# Patient Record
Sex: Male | Born: 1995 | Race: Black or African American | Hispanic: No | Marital: Single | Smoking: Never smoker
Health system: Southern US, Community
[De-identification: ages and names within clinical notes are randomized; demographics above are authoritative.]

## PROBLEM LIST (undated history)

## (undated) ENCOUNTER — Emergency Department (HOSPITAL_COMMUNITY): Admission: EM | Payer: 59 | Source: Home / Self Care

---
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
## 2013-03-03 ENCOUNTER — Emergency Department (HOSPITAL_COMMUNITY)
Admission: EM | Admit: 2013-03-03 | Discharge: 2013-03-04 | Disposition: A | Discharge status: Home / Self Care | Payer: 59 | Attending: Emergency Medicine | Admitting: Emergency Medicine

## 2013-03-03 ENCOUNTER — Encounter (HOSPITAL_COMMUNITY): Payer: Self-pay

## 2013-03-03 DIAGNOSIS — S01501A Unspecified open wound of lip, initial encounter: Secondary | ICD-10-CM | POA: Insufficient documentation

## 2013-03-03 DIAGNOSIS — IMO0002 Reserved for concepts with insufficient information to code with codable children: Secondary | ICD-10-CM | POA: Insufficient documentation

## 2013-03-03 DIAGNOSIS — Y929 Unspecified place or not applicable: Secondary | ICD-10-CM | POA: Insufficient documentation

## 2013-03-03 DIAGNOSIS — Z23 Encounter for immunization: Secondary | ICD-10-CM | POA: Insufficient documentation

## 2013-03-03 DIAGNOSIS — Y939 Activity, unspecified: Secondary | ICD-10-CM | POA: Insufficient documentation

## 2013-03-03 DIAGNOSIS — S01511A Laceration without foreign body of lip, initial encounter: Secondary | ICD-10-CM

## 2013-03-03 MED ORDER — LIDOCAINE HCL 1 % IJ SOLN
5.0000 mL | Freq: Once | INTRAMUSCULAR | Status: AC
  Administered 2013-03-04: 5 mL via INTRADERMAL
  Filled 2013-03-03: qty 20

## 2013-03-03 MED ORDER — OXYCODONE-ACETAMINOPHEN 5-325 MG PO TABS
2.0000 | ORAL_TABLET | Freq: Once | ORAL | Status: AC
  Administered 2013-03-04: 2 via ORAL
  Filled 2013-03-03: qty 2

## 2013-03-03 MED ORDER — TETANUS-DIPHTH-ACELL PERTUSSIS 5-2.5-18.5 LF-MCG/0.5 IM SUSP
0.5000 mL | Freq: Once | INTRAMUSCULAR | Status: AC
  Administered 2013-03-04: 0.5 mL via INTRAMUSCULAR
  Filled 2013-03-03: qty 0.5
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
## 2013-03-04 NOTE — ED Provider Notes (Signed)
Medical screening examination/treatment/procedure(s) were conducted as a shared visit with non-physician practitioner(s) and myself.  I personally evaluated the patient during the encounter  NERVE BLOCK Performed by: Lyanne Co Consent: Verbal consent obtained. Required items: required blood products, implants, devices, and special equipment available Time out: Immediately prior to procedure a "time out" was called to verify the correct patient, procedure, equipment, support staff and site/side marked as required. Indication: Laceration repair of the lip  Nerve block body site: Right infraorbital nerve  Preparation: Patient was prepped and draped in the usual sterile fashion. Needle gauge: 24 G Location technique: anatomical landmarks Local anesthetic: Lidocaine 1% without epi  Anesthetic total: 4 ml Outcome: pain improved Patient tolerance: Patient tolerated the procedure well with no immediate complications.  Vermilion border repair.   Complex lip laceration    Lyanne Co, MD 03/04/13 [phone]

## 2013-03-12 ENCOUNTER — Emergency Department (HOSPITAL_COMMUNITY)
Admission: EM | Admit: 2013-03-12 | Discharge: 2013-03-12 | Disposition: A | Discharge status: Home / Self Care | Payer: 59 | Attending: Emergency Medicine | Admitting: Emergency Medicine

## 2013-03-12 DIAGNOSIS — Z4802 Encounter for removal of sutures: Secondary | ICD-10-CM | POA: Insufficient documentation

## 2013-03-12 NOTE — ED Provider Notes (Signed)
Medical screening examination/treatment/procedure(s) were performed by non-physician practitioner and as supervising physician I was immediately available for consultation/collaboration.  Ethelda Chick, MD 03/12/13 [phone]

## 2013-03-12 NOTE — ED Notes (Signed)
Pt here for removal of sutures to upper lip. Wound edges approximated well with no signs of infection. Pt ambulatory to exam room with steady gait.

## 2013-03-12 NOTE — ED Notes (Signed)
Pt not in room to receive d/c instructions.  
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
## 2014-06-09 ENCOUNTER — Encounter (HOSPITAL_COMMUNITY): Payer: Self-pay | Admitting: Emergency Medicine

## 2014-06-09 ENCOUNTER — Emergency Department (HOSPITAL_COMMUNITY)
Admission: EM | Admit: 2014-06-09 | Discharge: 2014-06-10 | Disposition: A | Discharge status: Home / Self Care | Payer: 59 | Attending: Emergency Medicine | Admitting: Emergency Medicine

## 2014-06-09 DIAGNOSIS — Z202 Contact with and (suspected) exposure to infections with a predominantly sexual mode of transmission: Secondary | ICD-10-CM | POA: Diagnosis present

## 2014-06-09 DIAGNOSIS — R300 Dysuria: Secondary | ICD-10-CM | POA: Insufficient documentation

## 2014-06-09 DIAGNOSIS — R369 Urethral discharge, unspecified: Secondary | ICD-10-CM | POA: Diagnosis not present

## 2014-06-09 NOTE — ED Notes (Signed)
Pt presents with c/o possible STD exposure. Pt reports that he had unprotected sex approx 3 days ago and is now having discharge, white in color, and burning with urination.

## 2014-06-10 MED ORDER — ONDANSETRON 4 MG PO TBDP
4.0000 mg | ORAL_TABLET | Freq: Once | ORAL | Status: AC
  Administered 2014-06-10: 4 mg via ORAL
  Filled 2014-06-10: qty 1

## 2014-06-10 MED ORDER — CEFTRIAXONE SODIUM 250 MG IJ SOLR
250.0000 mg | Freq: Once | INTRAMUSCULAR | Status: AC
  Administered 2014-06-10: 250 mg via INTRAMUSCULAR
  Filled 2014-06-10: qty 250

## 2014-06-10 MED ORDER — AZITHROMYCIN 250 MG PO TABS
1000.0000 mg | ORAL_TABLET | Freq: Once | ORAL | Status: AC
  Administered 2014-06-10: 1000 mg via ORAL
  Filled 2014-06-10: qty 4

## 2014-06-10 MED ORDER — LIDOCAINE HCL 1 % IJ SOLN
INTRAMUSCULAR | Status: AC
  Administered 2014-06-10: 20 mL
  Filled 2014-06-10: qty 20

## 2014-06-10 NOTE — ED Provider Notes (Signed)
Medical screening examination/treatment/procedure(s) were performed by non-physician practitioner and as supervising physician I was immediately available for consultation/collaboration.   EKG Interpretation None       Zuleyka Kloc K Mayari Matus-Rasch, MD 06/10/14 [phone]

## 2014-06-10 NOTE — ED Provider Notes (Signed)
CSN: [id number]     Arrival date & time 06/09/14  2314 History   First MD Initiated Contact with Patient 06/09/14 2332     Chief Complaint  Patient presents with  . Exposure to STD     (Consider location/radiation/quality/duration/timing/severity/associated sxs/prior Treatment) HPI  Patient to the ED requesting treatment for STD exposure. He had unprotected sex 3 days ago and then developed discharge, white, and is also having some burning with urination. He denies having any suprapubic pain, abdominal pain, nausea, vomiting or diarrhea. Pt not having fevers. No testicular pain. Does not know if he has ever been checked for HIV  History reviewed. No pertinent past medical history. History reviewed. No pertinent past surgical history. No family history on file. History  Substance Use Topics  . Smoking status: Never Smoker   . Smokeless tobacco: Not on file  . Alcohol Use: No    Review of Systems   Review of Systems  Gen: no weight loss, fevers, chills, night sweats  Eyes: no occular draining, occular pain,  No visual changes  Nose: no epistaxis or rhinorrhea  Mouth: no dental pain, no sore throat  Neck: no neck pain  Lungs: No hemoptysis. No wheezing or coughing CV:  No palpitations, dependent edema or orthopnea. No chest pain Abd: no diarrhea. No nausea or vomiting, No abdominal pain  GU: + penile discharge and dysuria MSK:  No muscle weakness, No muscular pain Neuro: no headache, no focal neurologic deficits  Skin: no rash , no wounds Psyche: no complaints of depression or anxiety    Allergies  Review of patient's allergies indicates no known allergies.  Home Medications   Prior to Admission medications   Medication Sig Start Date End Date Taking? Authorizing Provider  ibuprofen (ADVIL,MOTRIN) 200 MG tablet Take 200 mg by mouth every 6 (six) hours as needed for pain (pain).    Historical Provider, MD   BP 133/82  Pulse 91  Temp(Src) 98 F (36.7 C) (Oral)   Resp 16  Ht 6\' 1"  (1.854 m)  Wt 193 lb (87.544 kg)  BMI 25.47 kg/m2  SpO2 100% Physical Exam  Nursing note and vitals reviewed. Constitutional: He appears well-developed and well-nourished. No distress.  HENT:  Head: Normocephalic and atraumatic.  Eyes: Pupils are equal, round, and reactive to light.  Neck: Normal range of motion. Neck supple.  Cardiovascular: Normal rate and regular rhythm.   Pulmonary/Chest: Effort normal.  Abdominal: Soft.  Neurological: He is alert.  Skin: Skin is warm and dry.    ED Course  Procedures (including critical care time) Labs Review Labs Reviewed  GC/CHLAMYDIA PROBE AMP    Imaging Review No results found.   EKG Interpretation None      MDM   Final diagnoses:  Penile discharge    Pt refuses blood draw, does not want to know if he has HIV. Informed that he could be a carrier and spreading it. By not using protection he is displaying risky behavior. Pt encouraged to get it soon.  Urinalysis GC prob ordered IM rocephin, Azithro and Zofran given in ED. Sex education, advised no sexual contact for at least 1 week.  18 y.o.Xavier Fernandez's evaluation in the Emergency Department is complete. It has been determined that no acute conditions requiring further emergency intervention are present at this time. The patient/guardian have been advised of the diagnosis and plan. We have discussed signs and symptoms that warrant return to the ED, such as changes or worsening in symptoms.  Vital signs are stable at discharge. Filed Vitals:   06/09/14 2325  BP: 133/82  Pulse: 91  Temp: 98 F (36.7 C)  Resp: 16    Patient/guardian has voiced understanding and agreed to follow-up with the PCP or specialist.     Dorthula Matas, PA-C 06/10/14 [phone]

## 2014-06-10 NOTE — Discharge Instructions (Signed)

## 2014-06-10 NOTE — ED Notes (Signed)
Pt verbalizes d/c instructions and directions for follow-up care. Pt voiced no additional questions or concerns.

## 2014-06-12 LAB — GC/CHLAMYDIA PROBE AMP
CT Probe RNA: NEGATIVE
GC Probe RNA: POSITIVE — AB

## 2014-06-13 ENCOUNTER — Telehealth (HOSPITAL_COMMUNITY): Payer: Self-pay

## 2014-08-28 ENCOUNTER — Emergency Department (HOSPITAL_COMMUNITY)
Admission: EM | Admit: 2014-08-28 | Discharge: 2014-08-28 | Disposition: A | Discharge status: Home / Self Care | Payer: 59 | Attending: Emergency Medicine | Admitting: Emergency Medicine

## 2014-08-28 ENCOUNTER — Encounter (HOSPITAL_COMMUNITY): Payer: Self-pay | Admitting: *Deleted

## 2014-08-28 DIAGNOSIS — R300 Dysuria: Secondary | ICD-10-CM | POA: Diagnosis present

## 2014-08-28 DIAGNOSIS — N342 Other urethritis: Secondary | ICD-10-CM | POA: Diagnosis not present

## 2014-08-28 DIAGNOSIS — Z8619 Personal history of other infectious and parasitic diseases: Secondary | ICD-10-CM | POA: Insufficient documentation

## 2014-08-28 LAB — URINALYSIS, ROUTINE W REFLEX MICROSCOPIC
Bilirubin Urine: NEGATIVE
GLUCOSE, UA: NEGATIVE mg/dL
Ketones, ur: NEGATIVE mg/dL
Nitrite: NEGATIVE
PROTEIN: NEGATIVE mg/dL
SPECIFIC GRAVITY, URINE: 1.028 (ref 1.005–1.030)
Urobilinogen, UA: 1 mg/dL (ref 0.0–1.0)
pH: 6.5 (ref 5.0–8.0)

## 2014-08-28 LAB — URINE MICROSCOPIC-ADD ON

## 2014-08-28 LAB — HIV ANTIBODY (ROUTINE TESTING W REFLEX): HIV 1&2 Ab, 4th Generation: NONREACTIVE

## 2014-08-28 LAB — RPR

## 2014-08-28 MED ORDER — AZITHROMYCIN 250 MG PO TABS
1000.0000 mg | ORAL_TABLET | Freq: Once | ORAL | Status: AC
  Administered 2014-08-28: 1000 mg via ORAL
  Filled 2014-08-28: qty 4

## 2014-08-28 MED ORDER — LIDOCAINE HCL 1 % IJ SOLN
INTRAMUSCULAR | Status: AC
  Administered 2014-08-28: 2 mL
  Filled 2014-08-28: qty 20

## 2014-08-28 MED ORDER — CIPROFLOXACIN HCL 500 MG PO TABS: 500.0000 mg | ORAL_TABLET | Freq: Two times a day (BID) | ORAL | Status: AC

## 2014-08-28 MED ORDER — DOXYCYCLINE HYCLATE 100 MG PO CAPS: 100.0000 mg | ORAL_CAPSULE | Freq: Two times a day (BID) | ORAL | Status: AC

## 2014-08-28 MED ORDER — CEFTRIAXONE SODIUM 250 MG IJ SOLR
250.0000 mg | Freq: Once | INTRAMUSCULAR | Status: AC
  Administered 2014-08-28: 250 mg via INTRAMUSCULAR
  Filled 2014-08-28: qty 250

## 2014-08-28 NOTE — ED Provider Notes (Signed)
CSN: [id number]     Arrival date & time 08/28/14  1337 History  This chart was scribed for Teressa LowerVrinda Romanda Turrubiates, FNP, working with Mirian MoMatthew Gentry, MD by Jolene Provostobert Halas, ED Scribe. This patient was seen in room WTR5/WTR5 and the patient's care was started at 1:59 PM.     Chief Complaint  Patient presents with  . Painful Urination   . Back Pain    HPI  HPI Comments: Xavier Fernandez is a 18 y.o. male who presents to the Emergency Department complaining of dysuria with associated penile discharge that started this morning. Pt denies multiple partners, fever, or chills. Pt states he has had STIs in the past (gonorrhea) and was treated successfully.   Pt also states he is having back pain which he associates with heavy lifting. 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    History reviewed. No pertinent past medical history. History reviewed. No pertinent past surgical history. No family history on file. History  Substance Use Topics  . Smoking status: Never Smoker   . Smokeless tobacco: Not on file  . Alcohol Use: No    Review of Systems  Constitutional: Negative for fever and chills.  Gastrointestinal: Negative for abdominal pain.  Genitourinary: Positive for dysuria, discharge and penile pain.  Musculoskeletal: Positive for back pain.    Allergies  Review of patient's allergies indicates no known allergies.  Home Medications   Prior to Admission medications   Medication Sig Start Date End Date Taking? Authorizing Provider  ibuprofen (ADVIL,MOTRIN) 200 MG tablet Take 200 mg by mouth every 6 (six) hours as needed for pain (pain).    Historical Provider, MD   BP 129/67 mmHg  Pulse 54  Temp(Src) 98.5 F (36.9 C) (Oral)  Resp 18  SpO2 99% Physical Exam  Constitutional: He is oriented to person, place, and time. He appears well-developed and well-nourished.  HENT:  Head: Normocephalic and atraumatic.  Eyes: Pupils are equal, round, and reactive to light.  Neck: Neck supple.   Cardiovascular: Normal rate and regular rhythm.   Pulmonary/Chest: Effort normal and breath sounds normal. No respiratory distress.  Genitourinary:  Clear discharge noted  Neurological: He is alert and oriented to person, place, and time.  Skin: Skin is warm and dry.  Psychiatric: He has a normal mood and affect. His behavior is normal.  Nursing note and vitals reviewed.   ED Course  Procedures  DIAGNOSTIC STUDIES: Oxygen Saturation is 99% on RA, normal by my interpretation.    COORDINATION OF CARE: 2:04 PM Discussed treatment plan with pt at bedside and pt agreed to plan.  Labs Review Labs Reviewed  GC/CHLAMYDIA PROBE AMP  URINALYSIS, ROUTINE W REFLEX MICROSCOPIC  HIV ANTIBODY (ROUTINE TESTING)  RPR    Imaging Review No results found.   EKG Interpretation None      MDM   Final diagnoses:  Urethritis    Pt treated here for sti. Cultures sent. Will send home on doxy and cipro. Discussed safe sex practices with pt  I personally performed the services described in this documentation, which was scribed in my presence. The recorded information has been reviewed and is accurate.   Teressa LowerVrinda Madsen Riddle, NP 08/28/14 1454  Mirian MoMatthew Gentry, MD 09/02/14 2351

## 2014-08-28 NOTE — ED Notes (Signed)
Pt complains of painful urination, discharge since this morning. Pt states he had gonorhea in August, which he states feels similar. Pt states he also has had back pain for a week. 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

## 2014-08-28 NOTE — Discharge Instructions (Signed)
Urethritis °Urethritis is an inflammation of the tube through which urine exits your bladder (urethra).  °CAUSES °Urethritis is often caused by an infection in your urethra. The infection can be viral, like herpes. The infection can also be bacterial, like gonorrhea. °RISK FACTORS °Risk factors of urethritis include: °· Having sex without using a condom. °· Having multiple sexual partners. °· Having poor hygiene. °SIGNS AND SYMPTOMS °Symptoms of urethritis are less noticeable in women than in men. These symptoms include: °· Burning feeling when you urinate (dysuria). °· Discharge from your urethra. °· Blood in your urine (hematuria). °· Urinating more than usual. °DIAGNOSIS  °To confirm a diagnosis of urethritis, your health care provider will do the following: °· Ask about your sexual history. °· Perform a physical exam. °· Have you provide a sample of your urine for lab testing. °· Use a cotton swab to gently collect a sample from your urethra for lab testing. °TREATMENT  °It is important to treat urethritis. Depending on the cause, untreated urethritis may lead to serious genital infections and possibly infertility. Urethritis caused by a bacterial infection is treated with antibiotic medicine. All sexual partners must be treated.  °HOME CARE INSTRUCTIONS °· Do not have sex until the test results are known and treatment is completed, even if your symptoms go away before you finish treatment. °· If you were prescribed an antibiotic, finish it all even if you start to feel better. °SEEK MEDICAL CARE IF:  °· Your symptoms are not improved in 3 days. °· Your symptoms are getting worse. °· You develop abdominal pain or pelvic pain (in women). °· You develop joint pain. °· You have a fever. °SEEK IMMEDIATE MEDICAL CARE IF:  °· You have severe pain in the belly, back, or side. °· You have repeated vomiting. °MAKE SURE YOU: °· Understand these instructions. °· Will watch your condition. °· Will get help right away if you  are not doing well or get worse. °Document Released: 02/17/2001 Document Revised: 01/08/2014 Document Reviewed: 04/24/2013 °ExitCare® Patient Information ©2015 [employer]. This information is not intended to replace advice given to you by your health care provider. Make sure you discuss any questions you have with your health care provider. ° °

## 2014-08-29 LAB — GC/CHLAMYDIA PROBE AMP
CT PROBE, AMP APTIMA: NEGATIVE
GC PROBE AMP APTIMA: POSITIVE — AB

## 2014-08-30 ENCOUNTER — Telehealth (HOSPITAL_BASED_OUTPATIENT_CLINIC_OR_DEPARTMENT_OTHER): Payer: Self-pay | Admitting: *Deleted

## 2014-09-16 ENCOUNTER — Encounter (HOSPITAL_COMMUNITY): Payer: Self-pay

## 2014-09-16 ENCOUNTER — Emergency Department (HOSPITAL_COMMUNITY)
Admission: EM | Admit: 2014-09-16 | Discharge: 2014-09-16 | Disposition: A | Discharge status: Home / Self Care | Payer: No Typology Code available for payment source | Attending: Emergency Medicine | Admitting: Emergency Medicine

## 2014-09-16 DIAGNOSIS — Z792 Long term (current) use of antibiotics: Secondary | ICD-10-CM | POA: Insufficient documentation

## 2014-09-16 DIAGNOSIS — Z711 Person with feared health complaint in whom no diagnosis is made: Secondary | ICD-10-CM

## 2014-09-16 DIAGNOSIS — Z113 Encounter for screening for infections with a predominantly sexual mode of transmission: Secondary | ICD-10-CM | POA: Insufficient documentation

## 2014-09-16 DIAGNOSIS — Z8619 Personal history of other infectious and parasitic diseases: Secondary | ICD-10-CM | POA: Insufficient documentation

## 2014-09-16 MED ORDER — CEFTRIAXONE SODIUM 250 MG IJ SOLR
250.0000 mg | Freq: Once | INTRAMUSCULAR | Status: AC
  Administered 2014-09-16: 250 mg via INTRAMUSCULAR
  Filled 2014-09-16: qty 250

## 2014-09-16 MED ORDER — AZITHROMYCIN 250 MG PO TABS
1000.0000 mg | ORAL_TABLET | Freq: Once | ORAL | Status: AC
  Administered 2014-09-16: 1000 mg via ORAL
  Filled 2014-09-16: qty 4

## 2014-09-16 MED ORDER — LIDOCAINE HCL 1 % IJ SOLN
INTRAMUSCULAR | Status: AC
  Administered 2014-09-16: 20 mL
  Filled 2014-09-16: qty 20

## 2014-09-16 NOTE — ED Notes (Signed)
Pt states treated in Dec for STD.  Started having burning at tip of penis this morning.  No discharge.  Feels like symptoms are coming back.

## 2014-09-16 NOTE — Discharge Instructions (Signed)
Wait 14 days prior to having sex again to ensure treatment has fully taken effect. Follow-up with the health dept for further STD needs. Return to the ED for new concerns.

## 2014-09-16 NOTE — ED Provider Notes (Signed)
CSN: [id number]     Arrival date & time 09/16/14  1306 History  This chart was scribed for non-physician practitioner Sharilyn SitesLisa Sanders, PA-C working with Rolan BuccoMelanie Belfi, MD by Littie Deedsichard Sun, ED Scribe. This patient was seen in room WTR8/WTR8 and the patient's care was started at 1:29 PM.        Chief Complaint  Patient presents with  . Groin Pain   The history is provided by the patient. No language interpreter was used.   HPI Comments: Colette RibasJarratt S Pelissier is a 19 y.o. male who presents to the Emergency Department complaining of burning pain at tip of his penis this morning which has resolved..  Patient denies any dysuria or urethral discharge.  States he was treated for gonorrhea in December and think he and partner may have had sex before meds took full effect.  States he has had gonorrhea 3x in the past and this feels the same.  Denies abdominal pain, testicle pain, nausea, vomiting, diarrhea.  Patient denies any current penile pain or burning.  History reviewed. No pertinent past medical history. History reviewed. No pertinent past surgical history. History reviewed. No pertinent family history. History  Substance Use Topics  . Smoking status: Never Smoker   . Smokeless tobacco: Not on file  . Alcohol Use: No    Review of Systems  Genitourinary: Positive for penile pain.  All other systems reviewed and are negative.     Allergies  Review of patient's allergies indicates no known allergies.  Home Medications   Prior to Admission medications   Medication Sig Start Date End Date Taking? Authorizing Provider  ciprofloxacin (CIPRO) 500 MG tablet Take 1 tablet (500 mg total) by mouth 2 (two) times daily. 08/28/14   Teressa LowerVrinda Pickering, NP  doxycycline (VIBRAMYCIN) 100 MG capsule Take 1 capsule (100 mg total) by mouth 2 (two) times daily. 08/28/14   Teressa LowerVrinda Pickering, NP  ibuprofen (ADVIL,MOTRIN) 200 MG tablet Take 200 mg by mouth every 6 (six) hours as needed for pain (pain).    Historical  Provider, MD   BP 135/88 mmHg  Pulse 69  Temp(Src) 98 F (36.7 C) (Oral)  Resp 20  SpO2 100%   Physical Exam  Constitutional: He is oriented to person, place, and time. He appears well-developed and well-nourished. No distress.  HENT:  Head: Normocephalic and atraumatic.  Mouth/Throat: Oropharynx is clear and moist.  Eyes: Conjunctivae and EOM are normal. Pupils are equal, round, and reactive to light.  Neck: Normal range of motion. Neck supple.  Cardiovascular: Normal rate, regular rhythm and normal heart sounds.   Pulmonary/Chest: Effort normal and breath sounds normal. No respiratory distress. He has no wheezes.  Abdominal: Soft. Bowel sounds are normal. There is no tenderness. There is no guarding.  Musculoskeletal: Normal range of motion.  Neurological: He is alert and oriented to person, place, and time.  Skin: Skin is warm and dry. He is not diaphoretic.  Psychiatric: He has a normal mood and affect.  Nursing note and vitals reviewed.   ED Course  Procedures  DIAGNOSTIC STUDIES: Oxygen Saturation is 100% on room air, normal by my interpretation.    COORDINATION OF CARE: 1:29 PM-Discussed treatment plan which includes treatment for STD with pt at bedside and pt agreed to plan.    Labs Review Labs Reviewed - No data to display  Imaging Review No results found.   EKG Interpretation None      MDM   Final diagnoses:  Concern about STD in male without  diagnosis   Patient declined genital exam or gc/chl swab, only wanted treatment. Patient given rocephin and azithromycin.  No dysuria, does not want urinalysis done.  Encouraged to wait 14 days prior to resuming sexual activity, discussed safe sex practices.  FU with health dept for further STD needs. Discussed plan with patient, he/she acknowledged understanding and agreed with plan of care.  Return precautions given for new or worsening symptoms.  I personally performed the services described in this  documentation, which was scribed in my presence. The recorded information has been reviewed and is accurate.  Garlon Hatchet, PA-C 09/16/14 1353  Rolan Bucco, MD 09/16/14 [phone]

## 2015-05-26 ENCOUNTER — Emergency Department (HOSPITAL_COMMUNITY)
Admission: EM | Admit: 2015-05-26 | Discharge: 2015-05-26 | Disposition: A | Discharge status: Home / Self Care | Payer: No Typology Code available for payment source | Attending: Emergency Medicine | Admitting: Emergency Medicine

## 2015-05-26 ENCOUNTER — Encounter (HOSPITAL_COMMUNITY): Payer: Self-pay | Admitting: Emergency Medicine

## 2015-05-26 DIAGNOSIS — Y92410 Unspecified street and highway as the place of occurrence of the external cause: Secondary | ICD-10-CM | POA: Diagnosis not present

## 2015-05-26 DIAGNOSIS — M545 Low back pain, unspecified: Secondary | ICD-10-CM

## 2015-05-26 DIAGNOSIS — Z792 Long term (current) use of antibiotics: Secondary | ICD-10-CM | POA: Diagnosis not present

## 2015-05-26 DIAGNOSIS — S3992XA Unspecified injury of lower back, initial encounter: Secondary | ICD-10-CM | POA: Diagnosis present

## 2015-05-26 DIAGNOSIS — Y998 Other external cause status: Secondary | ICD-10-CM | POA: Insufficient documentation

## 2015-05-26 DIAGNOSIS — Y9389 Activity, other specified: Secondary | ICD-10-CM | POA: Diagnosis not present
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
## 2015-05-26 NOTE — ED Notes (Signed)
Pt left without dc instructions.

## 2015-07-04 ENCOUNTER — Emergency Department (HOSPITAL_COMMUNITY)
Admission: EM | Admit: 2015-07-04 | Discharge: 2015-07-06 | Disposition: A | Discharge status: Home / Self Care | Payer: Self-pay | Attending: Emergency Medicine | Admitting: Emergency Medicine

## 2015-07-04 ENCOUNTER — Encounter (HOSPITAL_COMMUNITY): Payer: Self-pay | Admitting: Emergency Medicine

## 2015-07-04 DIAGNOSIS — F29 Unspecified psychosis not due to a substance or known physiological condition: Secondary | ICD-10-CM | POA: Insufficient documentation

## 2015-07-04 DIAGNOSIS — F1210 Cannabis abuse, uncomplicated: Secondary | ICD-10-CM | POA: Insufficient documentation

## 2015-07-04 DIAGNOSIS — F15950 Other stimulant use, unspecified with stimulant-induced psychotic disorder with delusions: Secondary | ICD-10-CM | POA: Diagnosis present

## 2015-07-04 DIAGNOSIS — F1510 Other stimulant abuse, uncomplicated: Secondary | ICD-10-CM | POA: Insufficient documentation

## 2015-07-04 DIAGNOSIS — F22 Delusional disorders: Secondary | ICD-10-CM | POA: Insufficient documentation

## 2015-07-04 LAB — RAPID URINE DRUG SCREEN, HOSP PERFORMED
Amphetamines: POSITIVE — AB
BARBITURATES: NOT DETECTED
BENZODIAZEPINES: NOT DETECTED
Cocaine: NOT DETECTED
Opiates: NOT DETECTED
Tetrahydrocannabinol: POSITIVE — AB

## 2015-07-04 LAB — COMPREHENSIVE METABOLIC PANEL
ALBUMIN: 4.8 g/dL (ref 3.5–5.0)
ALK PHOS: 67 U/L (ref 38–126)
ALT: 19 U/L (ref 17–63)
AST: 34 U/L (ref 15–41)
Anion gap: 8 (ref 5–15)
BILIRUBIN TOTAL: 1.1 mg/dL (ref 0.3–1.2)
BUN: 10 mg/dL (ref 6–20)
CALCIUM: 9.5 mg/dL (ref 8.9–10.3)
CO2: 24 mmol/L (ref 22–32)
CREATININE: 1.2 mg/dL (ref 0.61–1.24)
Chloride: 104 mmol/L (ref 101–111)
GFR calc Af Amer: >60 mL/min (ref >60)
GLUCOSE: 90 mg/dL (ref 65–99)
Potassium: 3.4 mmol/L — ABNORMAL LOW (ref 3.5–5.1)
Sodium: 136 mmol/L (ref 135–145)
TOTAL PROTEIN: 8.2 g/dL — AB (ref 6.5–8.1)

## 2015-07-04 LAB — ACETAMINOPHEN LEVEL: Acetaminophen (Tylenol), Serum: <10 ug/mL — ABNORMAL LOW (ref 10–30)

## 2015-07-04 LAB — CBC
HEMATOCRIT: 47.4 % (ref 39.0–52.0)
Hemoglobin: 16.9 g/dL (ref 13.0–17.0)
MCH: 31.6 pg (ref 26.0–34.0)
MCHC: 35.7 g/dL (ref 30.0–36.0)
MCV: 88.6 fL (ref 78.0–100.0)
PLATELETS: 215 10*3/uL (ref 150–400)
RBC: 5.35 MIL/uL (ref 4.22–5.81)
RDW: 12.6 % (ref 11.5–15.5)
WBC: 8.2 10*3/uL (ref 4.0–10.5)

## 2015-07-04 LAB — CBG MONITORING, ED: GLUCOSE-CAPILLARY: 81 mg/dL (ref 65–99)

## 2015-07-04 LAB — ETHANOL

## 2015-07-04 LAB — SALICYLATE LEVEL: Salicylate Lvl: <4 mg/dL (ref 2.8–30.0)

## 2015-07-04 MED ORDER — ZOLPIDEM TARTRATE 5 MG PO TABS
5.0000 mg | ORAL_TABLET | Freq: Every evening | ORAL | Status: DC | PRN
  Administered 2015-07-04: 5 mg via ORAL
  Filled 2015-07-04: qty 1

## 2015-07-04 MED ORDER — LORAZEPAM 1 MG PO TABS
1.0000 mg | ORAL_TABLET | Freq: Three times a day (TID) | ORAL | Status: DC | PRN
  Administered 2015-07-04: 1 mg via ORAL
  Filled 2015-07-04: qty 1

## 2015-07-04 MED ORDER — ONDANSETRON HCL 4 MG PO TABS: 4.0000 mg | ORAL_TABLET | Freq: Three times a day (TID) | ORAL | Status: DC | PRN

## 2015-07-04 MED ORDER — ALUM & MAG HYDROXIDE-SIMETH 200-200-20 MG/5ML PO SUSP
30.0000 mL | ORAL | Status: DC | PRN
Start: 2015-07-04 — End: 2015-07-06

## 2015-07-04 MED ORDER — IBUPROFEN 200 MG PO TABS: 600.0000 mg | ORAL_TABLET | Freq: Three times a day (TID) | ORAL | Status: DC | PRN

## 2015-07-04 MED ORDER — NICOTINE 21 MG/24HR TD PT24: 21.0000 mg | MEDICATED_PATCH | Freq: Every day | TRANSDERMAL | Status: DC

## 2015-07-04 NOTE — Progress Notes (Signed)
Pt in room with his mother Cm asked pt who his pcp ws and he looked a this mother who confirm pt is seen by cynthia white EPIC updated
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
## 2015-07-04 NOTE — ED Notes (Signed)
Patient oriented to room and unit.  Pt denies SI and HI.  Pt complains of hearing whispers but cannot understand them.  15 minute checks and video monitoring continues.

## 2015-07-04 NOTE — ED Notes (Addendum)
Per mother of patient, patient "took something" and now is having AVH, paranoid delusions about someone who is trying to kill. Pt states he believed the pill was adderall 15 mg, took two yesterday and one around 0600 today. Pt denies SI/HI, denies hx of psychiatric symptoms.

## 2015-07-04 NOTE — ED Notes (Addendum)
Mother reports that pt has been a good kid with no criminal record or anything like that and acted normal till 2 days ago. Mother lives with pt. Pt had a gun, which mother took away and gave to police. Per mother pt neighbors are not violent or killed anyone. Pt telling on someone who got a felony is all "in his head".   Pt reports that he has not slept in 3 days, has taken unprescribed 6 adderall in the last 2 days, (pt girlfriend takes adderall). Pt reports he took med because "he had to drive around a lot". When asked where he has been driving too, pt could not give an answer. Pt reports that his upstairs neighbors have killed people, and that he told on a guy and he got a felony and now pt is scared for his life. Has called police 6 times in the last 2 days.  AH, reports hearing whispers.   md talked with pt and pt started talking about [place] and [place] in [place], leaving for his safety, because someone took a "hit" out on him. Pt later told rn that the neighbors were coming up to the hospital to get him.  Pt being IVC.

## 2015-07-04 NOTE — ED Notes (Signed)
Patient appeared depressed, flat affect. Denied SI/HI. Pain was rated an 8 out of 1-10 scale in lower back. Feelings of anxiety rated an 8 on scale of 1-10. Feelings of depression rated a 10 on a 1-10 scale. Patient denied AVH currently.

## 2015-07-05 DIAGNOSIS — F15950 Other stimulant use, unspecified with stimulant-induced psychotic disorder with delusions: Secondary | ICD-10-CM | POA: Diagnosis present

## 2015-07-05 LAB — HIV ANTIBODY (ROUTINE TESTING W REFLEX): HIV SCREEN 4TH GENERATION: NONREACTIVE

## 2015-07-05 LAB — RPR: RPR Ser Ql: NONREACTIVE

## 2015-07-05 MED ORDER — HYDROXYZINE HCL 25 MG PO TABS: 25.0000 mg | ORAL_TABLET | Freq: Four times a day (QID) | ORAL | Status: DC | PRN

## 2015-07-05 MED ORDER — TRAZODONE HCL 100 MG PO TABS
100.0000 mg | ORAL_TABLET | Freq: Every day | ORAL | Status: DC
  Administered 2015-07-05: 100 mg via ORAL
  Filled 2015-07-05 (×2): qty 1

## 2015-07-05 NOTE — ED Notes (Signed)
Patients mom took belongings home with her. No Belongings sheet was accessible to me as of this current date.

## 2015-07-05 NOTE — BH Assessment (Signed)
Received phone call from pt's mother. This Clinical research associatewriter informed pt's mother that without pt's consent no information will be shared; however this Clinical research associatewriter will be willing to listen to her concerns. She reported that when she came to visit the pt today he asked her and his girlfriend did they see the man outside with the gun.

## 2015-07-05 NOTE — BHH Counselor (Signed)
Pending review for possible placement with [facility].  

## 2015-07-05 NOTE — Consult Note (Signed)
[facility] Consult   Reason for Consult:  Paranoia Referring Physician:  EDP Patient Identification: Xavier Fernandez MRN:  [id number] Principal Diagnosis: Amphetamine delusional disorder [facility]) Diagnosis:   Patient Active Problem List   Diagnosis Date Noted  . 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 male patient will be observed overnight for stabilization.  HPI:  19 yo male who presented to the ED yesterday with paranoia that people are trying to kill him, no psychiatric history.  19 y.o. male presenting to [facility] for a psychiatric evaluation. Patient was transported to [facility] by his mother. He lives with both parents. Patient's mother sts that she brought patient to the ED due to his recent bizarre behavior. Writer met with patient face to face. He immediately requested results for his AIDS test. Patient further explained that he need these results b/c he needs to know what is making him feel strange. Writer notified patient's nurse Ludwig Clarks) of HIV/AIDS request for results.   Mom reports a onset of symptoms 2 days ago. Patient pacing and looking out of window. He also contacted 911 6x's in the past 2 days. [employer] have arrived to the home on each occasion and patient has told the same story, "Someone is in my home trying to kill me". Sts that yesterdays patient came to work with her, stayed in her break room at work up to 12 hours, and did this because he didn't feel stafe at home.   Patient' mom confirmed that patient has no psychiatric history and this behavior started just 2 days ago. His mother asked patient if he took drugs and patient reported taking his girlfriends Adderral to help him focus. Patient admits to this stating he took #4 Adderall over the course of 2 days. Patient also admits to [facility] use. His reported last use was 28-30 days ago. Patient  has not eaten or slept in 2-3 days.  Today:  He reports having a good night sleep and no longer has paranoia.  Pate does feel he heard people talking (2 ex-girlfriends, 2-"homeboys", 1-enemy) about money being put on his head three days ago.  He started taking his girlfriend's Adderall to stay awake to prevent being killed.  This lack of sleep increased his paranoia.  He took out a warrant from [employer] against these people.  [place] also called the police four times over the past 3 days due to his paranoia of being killed.  Allegations not confirmed but his mother will look into this.  Denies suicidal/homicidal ideations, hallucinations, and alcohol abuse.  He feels back to "himself", his mother was at the bedside on assessment.  24 hour clearance needed to confirm stability for discharge.    Past Psychiatric History:  None  Risk to Self: Suicidal Ideation: No Suicidal Intent: No Is patient at risk for suicide?: No Suicidal Plan?: No Access to Means: No What has been your use of drugs/alcohol within the last 12 months?:  (UDS + for THC) How many times?:  (n/a) Other Self Harm Risks:  (n/a) Triggers for Past Attempts: Other (Comment) (n/a) Intentional Self Injurious Behavior: None Risk to Others: Homicidal Ideation: No Thoughts of Harm to Others: No Current Homicidal Intent: No Current Homicidal Plan: No Access to Homicidal Means: No Identified Victim:  (n/a) History of harm to others?: No Assessment of Violence: None Noted Violent Behavior Description:  (  patient is calm and cooperative ) Does patient have access to weapons?: No Criminal Charges Pending?: No Does patient have a court date: No Prior Inpatient Therapy: Prior Inpatient Therapy: No Prior Therapy Dates:  (n/a) Prior Therapy Facilty/Provider(s):  (n/a) Reason for Treatment:  (n/a) Prior Outpatient Therapy: Prior Outpatient Therapy: No Prior Therapy Dates:  (n/a) Prior Therapy Facilty/Provider(s):  (n/a) Reason  for Treatment:  (n/a) Does patient have an ACCT team?: No Does patient have Intensive In-House Services?  : No Does patient have [facility] services? : No Does patient have P4CC services?: No  Past Medical History: History reviewed. No pertinent past medical history. History reviewed. No pertinent past surgical history. Family History: No family history on file. Family Psychiatric  History: Paternal side--schizophrenia Social History:  History  Alcohol Use No     History  Drug Use No    Social History   Social History  . Marital Status: Single    Spouse Name: N/A  . Number of Children: N/A  . Years of Education: N/A   Social History Main Topics  . Smoking status: Never Smoker   . Smokeless tobacco: None  . Alcohol Use: No  . Drug Use: No  . Sexual Activity: Not Asked   Other Topics Concern  . None   Social History Narrative   Additional Social History:    Pain Medications: SEE MAR Prescriptions: SEE MAR Over the Counter: SEE MAR History of alcohol / drug use?: No history of alcohol / drug abuse Name of Substance 1: THC 1 - Age of First Use: patient unable to recall 1 - Amount (size/oz): patient unable to recall 1 - Frequency: "I don't use often" 1 - Duration: varies  1 - Last Use / Amount: "28-30 days ago" Name of Substance 2: Adderall ( girlfriends prescription) 2 - Age of First Use: 60 yrs old  2 - Amount (size/oz): "4 pills" 2 - Frequency: 4 pills over the course of 2 days 2 - Duration: 2 days 2 - Last Use / Amount: 07/03/2015                 Allergies:  No Known Allergies  Labs:  Results for orders placed or performed during the hospital encounter of 07/04/15 (from the past 48 hour(s))  CBG monitoring, ED     Status: None   Collection Time: 07/04/15 12:29 PM  Result Value Ref Range   Glucose-Capillary 81 65 - 99 mg/dL   Comment 1 Document in Chart   Comprehensive metabolic panel     Status: Abnormal   Collection Time: 07/04/15 12:35 PM  Result  Value Ref Range   Sodium 136 135 - 145 mmol/L   Potassium 3.4 (L) 3.5 - 5.1 mmol/L   Chloride 104 101 - 111 mmol/L   CO2 24 22 - 32 mmol/L   Glucose, Bld 90 65 - 99 mg/dL   BUN 10 6 - 20 mg/dL   Creatinine, Ser 1.20 0.61 - 1.24 mg/dL   Calcium 9.5 8.9 - 10.3 mg/dL   Total Protein 8.2 (H) 6.5 - 8.1 g/dL   Albumin 4.8 3.5 - 5.0 g/dL   AST 34 15 - 41 U/L   ALT 19 17 - 63 U/L   Alkaline Phosphatase 67 38 - 126 U/L   Total Bilirubin 1.1 0.3 - 1.2 mg/dL   GFR calc non Af Amer >60 >60 mL/min   GFR calc Af Amer >60 >60 mL/min    Comment: (NOTE) The eGFR has been calculated using  the CKD EPI equation. This calculation has not been validated in all clinical situations. eGFR's persistently <60 mL/min signify possible Chronic Kidney Disease.    Anion gap 8 5 - 15  Ethanol (ETOH)     Status: None   Collection Time: 07/04/15 12:35 PM  Result Value Ref Range   Alcohol, Ethyl (B) <5 <5 mg/dL    Comment:        LOWEST DETECTABLE LIMIT FOR SERUM ALCOHOL IS 5 mg/dL FOR MEDICAL PURPOSES ONLY   Salicylate level     Status: None   Collection Time: 07/04/15 12:35 PM  Result Value Ref Range   Salicylate Lvl <7.9 2.8 - 30.0 mg/dL  Acetaminophen level     Status: Abnormal   Collection Time: 07/04/15 12:35 PM  Result Value Ref Range   Acetaminophen (Tylenol), Serum <10 (L) 10 - 30 ug/mL    Comment:        THERAPEUTIC CONCENTRATIONS VARY SIGNIFICANTLY. A RANGE OF 10-30 ug/mL MAY BE AN EFFECTIVE CONCENTRATION FOR MANY PATIENTS. HOWEVER, SOME ARE BEST TREATED AT CONCENTRATIONS OUTSIDE THIS RANGE. ACETAMINOPHEN CONCENTRATIONS >150 ug/mL AT 4 HOURS AFTER INGESTION AND >50 ug/mL AT 12 HOURS AFTER INGESTION ARE OFTEN ASSOCIATED WITH TOXIC REACTIONS.   CBC     Status: None   Collection Time: 07/04/15 12:35 PM  Result Value Ref Range   WBC 8.2 4.0 - 10.5 K/uL   RBC 5.35 4.22 - 5.81 MIL/uL   Hemoglobin 16.9 13.0 - 17.0 g/dL   HCT 47.4 39.0 - 52.0 %   MCV 88.6 78.0 - 100.0 fL   MCH 31.6  26.0 - 34.0 pg   MCHC 35.7 30.0 - 36.0 g/dL   RDW 12.6 11.5 - 15.5 %   Platelets 215 150 - 400 K/uL  Urine rapid drug screen (hosp performed) (Not at [facility])     Status: Abnormal   Collection Time: 07/04/15 12:44 PM  Result Value Ref Range   Opiates NONE DETECTED NONE DETECTED   Cocaine NONE DETECTED NONE DETECTED   Benzodiazepines NONE DETECTED NONE DETECTED   Amphetamines POSITIVE (A) NONE DETECTED   Tetrahydrocannabinol POSITIVE (A) NONE DETECTED   Barbiturates NONE DETECTED NONE DETECTED    Comment:        DRUG SCREEN FOR MEDICAL PURPOSES ONLY.  IF CONFIRMATION IS NEEDED FOR ANY PURPOSE, NOTIFY LAB WITHIN 5 DAYS.        LOWEST DETECTABLE LIMITS FOR URINE DRUG SCREEN Drug Class       Cutoff (ng/mL) Amphetamine      1000 Barbiturate      200 Benzodiazepine   480 Tricyclics       165 Opiates          300 Cocaine          300 THC              50   RPR     Status: None   Collection Time: 07/04/15  4:00 PM  Result Value Ref Range   RPR Ser Ql Non Reactive Non Reactive    Comment: (NOTE) Performed At: [facility] [address] [place], [place] [id number] Lindon Romp MD [id number]   HIV antibody     Status: None   Collection Time: 07/04/15  4:00 PM  Result Value Ref Range   HIV Screen 4th Generation wRfx Non Reactive Non Reactive    Comment: (NOTE) Performed At: [facility] [place], [place] [id number] Lindon Romp MD TG:[id number]  Current Facility-Administered Medications  Medication Dose Route Frequency Provider Last Rate Last Dose  . alum & mag hydroxide-simeth (MAALOX/MYLANTA) 200-200-20 MG/5ML suspension 30 mL  30 mL Oral PRN Leonard Schwartz, MD      . ibuprofen (ADVIL,MOTRIN) tablet 600 mg  600 mg Oral Q8H PRN Leonard Schwartz, MD      . LORazepam (ATIVAN) tablet 1 mg  1 mg Oral Q8H PRN Leonard Schwartz, MD   1 mg at 07/04/15 2141  . nicotine (NICODERM CQ - dosed in mg/24 hours) patch 21 mg  21 mg Transdermal Daily Leonard Schwartz, MD   21 mg at 07/04/15 1802  . ondansetron (ZOFRAN) tablet 4 mg  4 mg Oral Q8H PRN Leonard Schwartz, MD      . traZODone (DESYREL) tablet 100 mg  100 mg Oral QHS Kaiel Weide       Current Outpatient Prescriptions  Medication Sig Dispense Refill  . BIOTIN PO Take 1 tablet by mouth daily.    . Cyanocobalamin (B-12 PO) Take 1-2 tablets by mouth daily.    . diphenhydramine-acetaminophen (TYLENOL PM) 25-500 MG TABS tablet Take 1-2 tablets by mouth at bedtime as needed (sleep).    . fluticasone (FLONASE) 50 MCG/ACT nasal spray Place 2 sprays into both nostrils daily as needed for allergies or rhinitis.    Marland Kitchen ibuprofen (ADVIL,MOTRIN) 200 MG tablet Take 200-400 mg by mouth every 6 (six) hours as needed for headache or moderate pain (pain).     Marland Kitchen PE-DM-APAP & Doxylamin-DM-APAP (VICKS DAYQUIL/NYQUIL CLD & FLU) (LIQUID) MISC Take 1 Dose by mouth daily as needed (allergies).    . ciprofloxacin (CIPRO) 500 MG tablet Take 1 tablet (500 mg total) by mouth 2 (two) times daily. (Patient not taking: Reported on 07/04/2015) 14 tablet 0  . doxycycline (VIBRAMYCIN) 100 MG capsule Take 1 capsule (100 mg total) by mouth 2 (two) times daily. (Patient not taking: Reported on 07/04/2015) 20 capsule 0    Musculoskeletal: Strength & Muscle Tone: within normal limits Gait & Station: normal Patient leans: N/A  Psychiatric Specialty Exam: Review of Systems  Constitutional: Negative.   HENT: Negative.   Eyes: Negative.   Respiratory: Negative.   Cardiovascular: Negative.   Gastrointestinal: Negative.   Genitourinary: Negative.   Musculoskeletal: Negative.   Skin: Negative.   Neurological: Negative.   Endo/Heme/Allergies: Negative.   Psychiatric/Behavioral:       Paranoia    Blood pressure 137/91, pulse 94, temperature 98.3 F (36.8 C), temperature source Oral, resp. rate 18, SpO2 100 %.There is no weight on file to calculate BMI.  General Appearance: Casual  Eye Contact::  Fair  Speech:  Normal Rate   Volume:  Normal  Mood:  Anxious  Affect:  Congruent  Thought Process:  Coherent  Orientation:  Full (Time, Place, and Person)  Thought Content:  WDL  Suicidal Thoughts:  No  Homicidal Thoughts:  No  Memory:  Immediate;   Good Recent;   Good Remote;   Good  Judgement:  Fair  Insight:  Fair  Psychomotor Activity:  Normal  Concentration:  Good  Recall:  Good  Fund of Knowledge:Good  Language: Good  Akathisia:  No  Handed:  Right  AIMS (if indicated):     Assets:  Housing Leisure Time Physical Health Resilience Social Support  ADL's:  Intact  Cognition: WNL  Sleep:      Treatment Plan Summary: Daily contact with patient to assess and evaluate symptoms and progress in treatment, Medication management and Plan amphetamine delusional  disorder: -Crisis stabilization -Medication management:  Trazodone 100 mg at bedtime for sleep issues, Ativan 1 mg every 8 hours PRN anxiety -Individual and substance abuse counseling, family counseling  Disposition: Supportive therapy provided about ongoing stressors.  Waylan Boga, [place] 07/05/2015 1:25 PM Patient seen face-to-face for psychiatric evaluation, chart reviewed and case discussed with the physician extender and developed treatment plan. Reviewed the information documented and agree with the treatment plan. Corena Pilgrim, MD

## 2015-07-05 NOTE — BHH Counselor (Signed)
07/05/15 Referral sent to [facility], [facility], [facility] [place], [place], [place], [place], [facility] [place], [facility], [place], [place], [place] [facility], [place], [place], [place], [place], [place], [facility], [place], and [facility]  [place] [facility]. Sherlon HandingHarris, LCAS-A, LPC-A, [facility]  Counselor 07/05/2015 1:58 AM

## 2015-07-05 NOTE — ED Notes (Addendum)
Patient up in hall talking on phone.  Ask nurse for NS to wash his body to get rid of the adderall he has been taking.  Informed patient he could not wash away something he indigested.  Patient very paranoid putting bedside table in front of door.  Denies SI/HI Will continue to monitor for safety.

## 2015-07-06 NOTE — Discharge Instructions (Signed)
Psychosis °Psychosis refers to a severe loss of contact with reality. During a psychotic episode, a person is not able to think clearly, and his or her emotions and responses do not match up with what is actually happening. Someone may have false beliefs about what is happening or who they are (delusions). Someone may see, hear, taste, smell, or feel things that are not present (hallucinations).  °Psychosis usually occurs with very serious mental health (psychiatric) conditions such as schizophrenia, bipolar disorder, or major depression. It can sometimes also be the result of drug use or certain medical conditions. °SYMPTOMS °Symptoms of a psychotic episode include: °· Delusions, such as: °¨ Feeling excessive fear or suspicion (paranoia). °¨ Believing something that is odd, unrealistic, or false, such as having a false belief about being someone else. °· Hallucinations. °· Disorganized thinking, such as thoughts that jump from one to another that do not make sense to others. °· Disorganized speech, such as saying things that do not make sense to others. °· Inappropriate behavior, such as talking to oneself or intruding on unfamiliar people. °DIAGNOSIS °A diagnosis of psychosis is made through an assessment by a health care provider, who will ask questions about thoughts, feelings, behavior, drug use, and medical conditions. The health care provider may also do one or more of the following: °· Physical exam. °· Blood tests. °· Brain imaging, such as a CT scan or MRI. °· Brain wave study (EEG). °The health care provider may make a referral for further evaluation by a mental health professional. °TREATMENT  °Treatment depends on the cause of the psychosis. Treatment may include one or more of the following: °· Monitoring and supportive care in the emergency room or hospital.   °· Taking medicines (antipsychotic medicine) to reduce symptoms and to balance chemicals in the brain. °· Treating an underlying medical  condition. °· Stopping or reducing drugs that are causing psychosis. °· Therapy and other supportive programs outside of the hospital. °HOME CARE INSTRUCTIONS °· Over-the-counter and prescription medicines should be taken only as told by the health care provider. °· The health care provider should be consulted before over-the-counter medicines, herbs, or supplements are used. °· All follow-up visits should be kept as told by the health care provider. This is important. °· A healthy lifestyle should be maintained. This includes: °¨ Eating a healthy diet. °¨ Getting enough sleep. °¨ Exercising regularly. °¨ Avoiding alcohol and recreational drugs as told by the health care provider. °SEEK MEDICAL CARE IF: °· Medicines do not seem to be helping. °· The person hears voices telling him or her to do things. °· The person continues to see, smell, or feel things that are not there. °· The person feels extremely fearful and suspicious that someone or something will harm him or her. °· The person feels unable to leave his or her house. °· The person has trouble taking care of himself or herself. °· The person experiences side effects of medicines, such as: °¨ Changes in sleep patterns. °¨ Dizziness. °¨ Weight gain. °¨ Restlessness. °¨ Movement changes. °¨ Muscle spasms. °¨ Tremors. °SEEK IMMEDIATE MEDICAL CARE IF: °· Serious thoughts occur about self-harm or about hurting others. °· There are serious side effects of medicine, such as: °¨ Swelling of the face, lips, tongue, or throat. °¨ Fever, confusion, muscle spasms, or seizures. °  °This information is not intended to replace advice given to you by your health care provider. Make sure you discuss any questions you have with your health care provider. °  °  Document Released: 02/11/2010 Document Revised: 01/08/2015 Document Reviewed: 08/28/2014 °Elsevier Interactive Patient Education ©2016 [employer] ° °

## 2015-07-06 NOTE — Consult Note (Signed)
[facility]   Reason for Consult:  Paranoia Referring Physician:  EDP Patient Identification: Xavier RibasJarratt S Fernandez MRN:  [id number] Principal Diagnosis: Amphetamine delusional disorder [facility](HCC) Diagnosis:   Patient Active Problem List   Diagnosis Date Noted  . 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 male patient who states "I am feeling better."  HPI:  Xavier Fernandez is a 19 yo [place] male who presented to the ED with paranoia that people were trying to kill him. He denies past psychiatric history. He attributes this paranoia to taking Adderall which he took "to improve my performance at work." He reports he took 1 capsule of Adderall on Monday, 1 capsule on Tuesday, and 2 capsules on Wednesday. He reports after taking the Adderall he stayed awake for 3 days and became paranoid, was seeing spots, and "hearing whispers."   His mother is present for the assessment today with Carney's consent. Mother states "he seems back to normal."  Xavier Fernandez denies alcohol use. He reports he smoked marijuana 2-3 weeks ago. He denies suicidal or homicidal ideation, intent, or plan. He denies AVH.    Mother [place] live with family if discharged.   Past Psychiatric History:  None  Risk to Self: Suicidal Ideation: No Suicidal Intent: No Is patient at risk for suicide?: No Suicidal Plan?: No Access to Means: No What has been your use of drugs/alcohol within the last 12 months?:  (UDS + for THC) How many times?:  (n/a) Other Self Harm Risks:  (n/a) Triggers for Past Attempts: Other (Comment) (n/a) Intentional Self Injurious Behavior: None Risk to Others: Homicidal Ideation: No Thoughts of Harm to Others: No Current Homicidal Intent: No Current Homicidal Plan: No Access to Homicidal Means: No Identified Victim:  (n/a) History of harm  to others?: No Assessment of Violence: None Noted Violent Behavior Description:  (patient is calm and cooperative ) Does patient have access to weapons?: No Criminal Charges Pending?: No Does patient have a court date: No Prior Inpatient Therapy: Prior Inpatient Therapy: No Prior Therapy Dates:  (n/a) Prior Therapy Facilty/Provider(s):  (n/a) Reason for Treatment:  (n/a) Prior Outpatient Therapy: Prior Outpatient Therapy: No Prior Therapy Dates:  (n/a) Prior Therapy Facilty/Provider(s):  (n/a) Reason for Treatment:  (n/a) Does patient have an ACCT team?: No Does patient have Intensive In-House Services?  : No Does patient have [facility] services? : No Does patient have P4CC services?: No  Past Medical History: History reviewed. No pertinent past medical history. History reviewed. No pertinent past surgical history. Family History: No family history on file. Family Psychiatric  History: Paternal side--schizophrenia Social History:  History  Alcohol Use No     History  Drug Use No    Social History   Social History  . Marital Status: Single    Spouse Name: N/A  . Number of Children: N/A  . Years of Education: N/A   Social History Main Topics  . Smoking status: Never Smoker   . Smokeless tobacco: None  . Alcohol Use: No  . Drug Use: No  . Sexual Activity: Not Asked   Other Topics Concern  . None   Social History Narrative   Additional Social History:    Pain Medications: SEE MAR Prescriptions: SEE MAR Over the Counter: SEE MAR History of alcohol / drug use?: No history of alcohol / drug abuse Name  of Substance 1: THC 1 - Age of First Use: patient unable to recall 1 - Amount (size/oz): patient unable to recall 1 - Frequency: "I don't use often" 1 - Duration: varies  1 - Last Use / Amount: "28-30 days ago" Name of Substance 2: Adderall ( girlfriends prescription) 2 - Age of First Use: 50 yrs old  2 - Amount (size/oz): "4 pills" 2 - Frequency: 4 pills over the  course of 2 days 2 - Duration: 2 days 2 - Last Use / Amount: 07/03/2015   Allergies:  No Known Allergies  Labs:  Results for orders placed or performed during the hospital encounter of 07/04/15 (from the past 48 hour(s))  Urine rapid drug screen (hosp performed) (Not at [facility])     Status: Abnormal   Collection Time: 07/04/15 12:44 PM  Result Value Ref Range   Opiates NONE DETECTED NONE DETECTED   Cocaine NONE DETECTED NONE DETECTED   Benzodiazepines NONE DETECTED NONE DETECTED   Amphetamines POSITIVE (A) NONE DETECTED   Tetrahydrocannabinol POSITIVE (A) NONE DETECTED   Barbiturates NONE DETECTED NONE DETECTED    Comment:        DRUG SCREEN FOR MEDICAL PURPOSES ONLY.  IF CONFIRMATION IS NEEDED FOR ANY PURPOSE, NOTIFY LAB WITHIN 5 DAYS.        LOWEST DETECTABLE LIMITS FOR URINE DRUG SCREEN Drug Class       Cutoff (ng/mL) Amphetamine      1000 Barbiturate      200 Benzodiazepine   200 Tricyclics       300 Opiates          300 Cocaine          300 THC              50   RPR     Status: None   Collection Time: 07/04/15  4:00 PM  Result Value Ref Range   RPR Ser Ql Non Reactive Non Reactive    Comment: (NOTE) Performed At: [facility] [address] [place], [place] [id number] Mila Homer MD NW:[id number]   HIV antibody     Status: None   Collection Time: 07/04/15  4:00 PM  Result Value Ref Range   HIV Screen 4th Generation wRfx Non Reactive Non Reactive    Comment: (NOTE) Performed At: [facility] [address] [place], [place] [id number] Mila Homer MD BM:[id number]     Current Facility-Administered Medications  Medication Dose Route Frequency Provider Last Rate Last Dose  . alum & mag hydroxide-simeth (MAALOX/MYLANTA) 200-200-20 MG/5ML suspension 30 mL  30 mL Oral PRN Nelva Nay, MD      . hydrOXYzine (ATARAX/VISTARIL) tablet 25 mg  25 mg Oral Q6H PRN Charm Rings, NP      . ibuprofen (ADVIL,MOTRIN) tablet 600 mg  600 mg Oral Q8H  PRN Nelva Nay, MD      . nicotine (NICODERM CQ - dosed in mg/24 hours) patch 21 mg  21 mg Transdermal Daily Nelva Nay, MD   21 mg at 07/04/15 1802  . ondansetron (ZOFRAN) tablet 4 mg  4 mg Oral Q8H PRN Nelva Nay, MD      . traZODone (DESYREL) tablet 100 mg  100 mg Oral QHS Mojeed Akintayo   100 mg at 07/05/15 2230   Current Outpatient Prescriptions  Medication Sig Dispense Refill  . BIOTIN PO Take 1 tablet by mouth daily.    . Cyanocobalamin (B-12 PO) Take 1-2 tablets by mouth daily.    Marland Kitchen  diphenhydramine-acetaminophen (TYLENOL PM) 25-500 MG TABS tablet Take 1-2 tablets by mouth at bedtime as needed (sleep).    . fluticasone (FLONASE) 50 MCG/ACT nasal spray Place 2 sprays into both nostrils daily as needed for allergies or rhinitis.    Marland Kitchen ibuprofen (ADVIL,MOTRIN) 200 MG tablet Take 200-400 mg by mouth every 6 (six) hours as needed for headache or moderate pain (pain).     Marland Kitchen PE-DM-APAP & Doxylamin-DM-APAP (VICKS DAYQUIL/NYQUIL CLD & FLU) (LIQUID) MISC Take 1 Dose by mouth daily as needed (allergies).    . ciprofloxacin (CIPRO) 500 MG tablet Take 1 tablet (500 mg total) by mouth 2 (two) times daily. (Patient not taking: Reported on 07/04/2015) 14 tablet 0  . doxycycline (VIBRAMYCIN) 100 MG capsule Take 1 capsule (100 mg total) by mouth 2 (two) times daily. (Patient not taking: Reported on 07/04/2015) 20 capsule 0    Musculoskeletal: Strength & Muscle Tone: within normal limits Gait & Station: normal Patient leans: N/A  Psychiatric Specialty Exam: Review of Systems  Constitutional: Negative.   HENT: Negative.   Eyes: Negative.   Respiratory: Negative.   Cardiovascular: Negative.   Gastrointestinal: Negative.   Genitourinary: Negative.   Musculoskeletal: Negative.   Skin: Negative.   Neurological: Negative.   Endo/Heme/Allergies: Negative.   Psychiatric/Behavioral:       Paranoia    Blood pressure 113/73, pulse 118, temperature 98.5 F (36.9 C), temperature source Oral,  resp. rate 20, SpO2 99 %.There is no weight on file to calculate BMI.  General Appearance: Casual  Eye Contact::  Good  Speech:  Normal Rate  Volume:  Normal  Mood:  Euthymic  Affect:  Congruent  Thought Process:  Coherent  Orientation:  Full (Time, Place, and Person)  Thought Content:  WDL  Suicidal Thoughts:  No  Homicidal Thoughts:  No  Memory:  Immediate;   Good Recent;   Good Remote;   Good  Judgement:  Fair  Insight:  Fair  Psychomotor Activity:  Normal  Concentration:  Good  Recall:  Good  Fund of Knowledge:Good  Language: Good  Akathisia:  No  Handed:  Right  AIMS (if indicated):     Assets:  Housing Leisure Time Physical Health Resilience Social Support  ADL's:  Intact  Cognition: WNL  Sleep:      Disposition: Case discussed with Dr. Jama Flavors. Patient will be discharged home with mother. Patient given resources for outpatient followup in [place], [place].   Alberteen Sam, FNP-BC [facility] 07/06/2015 12:35 PM Patient seen and discussed with me , as above  Nehemiah Massed, MD

## 2015-07-06 NOTE — ED Notes (Addendum)
Urine collected for GC/CLy

## 2015-07-06 NOTE — ED Notes (Signed)
Up to the bathroom 

## 2015-07-06 NOTE — ED Notes (Signed)
IVC has been rescinded 

## 2015-07-06 NOTE — ED Notes (Addendum)
Pt denied si/hi/avh on dc and was encouraged to follow up with his dr and to return/seek treatment for any si/ha thoughts, avh or paranoia.  Pt encouraged to eat regular meals and get adequate sleep at night.  Pt verbalized understanding. Pt's dad is here to see him, will take him home and has brought clothes .

## 2015-07-06 NOTE — [facility] Suicide Risk Assessment (Signed)
Suicide Risk Assessment  Discharge Assessment   [facility]   Demographic Factors:  Male, Adolescent or young adult and Unemployed  Total Time spent with patient: 25 minutes Musculoskeletal: Strength & Muscle Tone: within normal limits Gait & Station: normal Patient leans: N/A  Psychiatric Specialty Exam: Review of Systems  Constitutional: Negative.   HENT: Negative.   Eyes: Negative.   Respiratory: Negative.   Cardiovascular: Negative.   Gastrointestinal: Negative.   Genitourinary: Negative.   Musculoskeletal: Negative.   Skin: Negative.   Neurological: Negative.   Endo/Heme/Allergies: Negative.   Psychiatric/Behavioral:       Paranoia    Blood pressure 113/73, pulse 118, temperature 98.5 F (36.9 C), temperature source Oral, resp. rate 20, SpO2 99 %.There is no weight on file to calculate BMI.  General Appearance: Casual  Eye Contact::  Good  Speech:  Normal Rate  Volume:  Normal  Mood:  Euthymic  Affect:  Congruent  Thought Process:  Coherent  Orientation:  Full (Time, Place, and Person)  Thought Content:  WDL  Suicidal Thoughts:  No  Homicidal Thoughts:  No  Memory:  Immediate;   Good Recent;   Good Remote;   Good  Judgement:  Fair  Insight:  Fair  Psychomotor Activity:  Normal  Concentration:  Good  Recall:  Good  Fund of Knowledge:Good  Language: Good  Akathisia:  No  Handed:  Right  AIMS (if indicated):     Assets:  Housing Leisure Time Physical Health Resilience Social Support  ADL's:  Intact  Cognition: WNL  Sleep:           Has this patient used any form of tobacco in the last 30 days? (Cigarettes, Smokeless Tobacco, Cigars, and/or Pipes) No  Mental Status Per Nursing Assessment::   On Admission:   Paranoia  Current Mental Status by Physician: NA  Loss Factors: NA  Historical Factors: NA  Risk Reduction Factors:   Sense of responsibility to family, Living with another person, especially a relative  and Positive therapeutic relationship  Continued Clinical Symptoms:  Alcohol/Substance Abuse/Dependencies  Cognitive Features That Contribute To Risk:  Closed-mindedness    Suicide Risk:  Minimal: No identifiable suicidal ideation.  Patients presenting with no risk factors but with morbid ruminations; may be classified as minimal risk based on the severity of the depressive symptoms  Principal Problem: Amphetamine delusional disorder [facility](HCC) Discharge Diagnoses:  Patient Active Problem List   Diagnosis Date Noted  . 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   Is patient on multiple antipsychotic therapies at discharge:  No   Has Patient had three or more failed trials of antipsychotic monotherapy by history:  No  Recommended Plan for Multiple Antipsychotic Therapies: NA    Alberteen SamFran Jullian Previti, FNP-BC [facility] 07/06/2015, 12:37 PM

## 2015-07-06 NOTE — ED Notes (Addendum)
Dr Jama Flavorscobos and Drenda FreezeFran NP into see, mother at bedside.  Pt has given verbal permission for mother to be present when the MD evaluates

## 2015-07-06 NOTE — ED Notes (Signed)
Patient is calm and cooperative at the time of discharge.  Patient expresses readiness for discharge.  Discharge paperwork is reviewed with the patient and he voices understanding.  Patient is escorted to the lobby and remains safe at the time of discharge.

## 2015-07-06 NOTE — Progress Notes (Signed)
12:30pm. Xavier Fernandez rescinds pt's IVC. CSW faxed and filed paperwork.  CSW met with pt and discussed outpatient resources in [place], [place], where he is going to live after discharge. Provided a handout with various facilities in the [place] area. Pt had no questions, thanked CSW for resources.  [place] [facility] phone: [phone]

## 2015-07-06 NOTE — ED Notes (Signed)
Dr Jama Flavorsobos and Drenda FreezeFran NP in w/ pt and mother

## 2015-07-06 NOTE — ED Notes (Signed)
Up on the phone 

## 2015-07-08 LAB — GC/CHLAMYDIA PROBE AMP (~~LOC~~) NOT AT ARMC
CHLAMYDIA, DNA PROBE: NEGATIVE
NEISSERIA GONORRHEA: NEGATIVE

## 2015-08-01 ENCOUNTER — Inpatient Hospital Stay
Admission: EM | Admit: 2015-08-01 | Discharge: 2015-08-05 | DRG: 885 | Disposition: A | Discharge status: Home / Self Care | Payer: No Typology Code available for payment source | Source: Ambulatory Visit | Attending: Psychiatry | Admitting: Psychiatry

## 2015-08-01 ENCOUNTER — Emergency Department
Admission: EM | Admit: 2015-08-01 | Discharge: 2015-08-01 | Disposition: A | Discharge status: Other Acute Inpatient Hospital | Payer: No Typology Code available for payment source | Attending: Emergency Medicine | Admitting: Emergency Medicine

## 2015-08-01 ENCOUNTER — Inpatient Hospital Stay: Payer: No Typology Code available for payment source | Admitting: Psychiatry

## 2015-08-01 ENCOUNTER — Telehealth (INDEPENDENT_AMBULATORY_CARE_PROVIDER_SITE_OTHER): Payer: Self-pay

## 2015-08-01 DIAGNOSIS — F200 Paranoid schizophrenia: Secondary | ICD-10-CM

## 2015-08-01 DIAGNOSIS — F29 Unspecified psychosis not due to a substance or known physiological condition: Principal | ICD-10-CM | POA: Diagnosis present

## 2015-08-01 DIAGNOSIS — R440 Auditory hallucinations: Secondary | ICD-10-CM | POA: Insufficient documentation

## 2015-08-01 DIAGNOSIS — F419 Anxiety disorder, unspecified: Secondary | ICD-10-CM | POA: Diagnosis present

## 2015-08-01 DIAGNOSIS — F329 Major depressive disorder, single episode, unspecified: Secondary | ICD-10-CM | POA: Diagnosis present

## 2015-08-01 DIAGNOSIS — R45851 Suicidal ideations: Secondary | ICD-10-CM | POA: Diagnosis present

## 2015-08-01 DIAGNOSIS — R030 Elevated blood-pressure reading, without diagnosis of hypertension: Secondary | ICD-10-CM | POA: Diagnosis present

## 2015-08-01 LAB — CBC AND DIFFERENTIAL
Basophils Absolute Automated: 0.04 10*3/uL (ref 0.00–0.20)
Basophils Automated: 1 %
Eosinophils Absolute Automated: 0.03 10*3/uL (ref 0.00–0.70)
Eosinophils Automated: 0 %
Hematocrit: 46.2 % (ref 42.0–52.0)
Hgb: 16.9 g/dL (ref 13.0–17.0)
Lymphocytes Absolute Automated: 1.05 10*3/uL (ref 0.50–4.40)
Lymphocytes Automated: 14 %
MCH: 31.2 pg (ref 28.0–32.0)
MCHC: 36.6 g/dL — ABNORMAL HIGH (ref 32.0–36.0)
MCV: 85.2 fL (ref 80.0–100.0)
MPV: 10.5 fL (ref 9.4–12.3)
Monocytes Absolute Automated: 0.91 10*3/uL (ref 0.00–1.20)
Monocytes: 13 %
Neutrophils Absolute: 5.2 10*3/uL (ref 1.80–8.10)
Neutrophils: 72 %
Platelets: 193 10*3/uL (ref 140–400)
RBC: 5.42 10*6/uL (ref 4.70–6.00)
RDW: 12 % (ref 12–15)
WBC: 7.23 10*3/uL (ref 3.50–10.80)

## 2015-08-01 LAB — COMPREHENSIVE METABOLIC PANEL
ALT: 14 U/L (ref 0–55)
AST (SGOT): 22 U/L (ref 5–34)
Albumin/Globulin Ratio: 1.5 (ref 0.9–2.2)
Albumin: 4.5 g/dL (ref 3.5–5.0)
Alkaline Phosphatase: 65 U/L (ref 38–106)
Anion Gap: 11 (ref 5.0–15.0)
BUN: 10 mg/dL (ref 9.0–28.0)
Bilirubin, Total: 0.9 mg/dL (ref 0.2–1.2)
CO2: 22 mEq/L (ref 22–29)
Calcium: 9.5 mg/dL (ref 8.5–10.5)
Chloride: 106 mEq/L (ref 100–111)
Creatinine: 1.2 mg/dL (ref 0.7–1.3)
Globulin: 3 g/dL (ref 2.0–3.6)
Glucose: 101 mg/dL — ABNORMAL HIGH (ref 70–100)
Potassium: 3.5 mEq/L (ref 3.5–5.1)
Protein, Total: 7.5 g/dL (ref 6.0–8.3)
Sodium: 139 mEq/L (ref 136–145)

## 2015-08-01 LAB — URINALYSIS, REFLEX TO MICROSCOPIC EXAM IF INDICATED
Bilirubin, UA: NEGATIVE
Blood, UA: NEGATIVE
Glucose, UA: 50 — AB
Ketones UA: NEGATIVE
Leukocyte Esterase, UA: NEGATIVE
Nitrite, UA: NEGATIVE
Protein, UR: 30 — AB
Specific Gravity UA: 1.02 (ref 1.001–1.035)
Urine pH: 6 (ref 5.0–8.0)
Urobilinogen, UA: NORMAL mg/dL

## 2015-08-01 LAB — ETHANOL: Alcohol: NOT DETECTED mg/dL

## 2015-08-01 LAB — SALICYLATE LEVEL: Salicylate Level: <5 mg/dL — ABNORMAL LOW (ref 15.0–30.0)

## 2015-08-01 LAB — RAPID DRUG SCREEN, URINE
Barbiturate Screen, UR: NEGATIVE
Benzodiazepine Screen, UR: NEGATIVE
Cannabinoid Screen, UR: NEGATIVE
Cocaine, UR: NEGATIVE
Opiate Screen, UR: NEGATIVE
PCP Screen, UR: NEGATIVE
Urine Amphetamine Screen: NEGATIVE

## 2015-08-01 LAB — GFR: EGFR: >60

## 2015-08-01 LAB — ACETAMINOPHEN LEVEL: Acetaminophen Level: <6 ug/mL — ABNORMAL LOW (ref 10–30)

## 2015-08-01 MED ORDER — RISPERIDONE 1 MG PO TABS
2.0000 mg | ORAL_TABLET | Freq: Every evening | ORAL | Status: DC
Start: 2015-08-01 — End: 2015-08-05
  Administered 2015-08-02 – 2015-08-04 (×3): 2 mg via ORAL
  Filled 2015-08-01 (×4): qty 2

## 2015-08-01 MED ORDER — LORAZEPAM 1 MG PO TABS
1.0000 mg | ORAL_TABLET | ORAL | Status: DC | PRN
Start: 2015-08-01 — End: 2015-08-05
  Administered 2015-08-03 – 2015-08-05 (×3): 1 mg via ORAL
  Filled 2015-08-01 (×4): qty 1

## 2015-08-01 MED ORDER — ZOLPIDEM TARTRATE 5 MG PO TABS
10.0000 mg | ORAL_TABLET | Freq: Every evening | ORAL | Status: DC | PRN
Start: 2015-08-01 — End: 2015-08-05
  Filled 2015-08-01: qty 2

## 2015-08-01 MED ORDER — LORAZEPAM 2 MG/ML IJ SOLN
0.5000 mg | Freq: Once | INTRAMUSCULAR | Status: AC
Start: 2015-08-01 — End: 2015-08-01
  Administered 2015-08-01: 0.5 mg via INTRAVENOUS
  Filled 2015-08-01: qty 1

## 2015-08-01 MED ORDER — OLANZAPINE 10 MG PO TBDP
10.0000 mg | ORAL_TABLET | Freq: Three times a day (TID) | ORAL | Status: DC | PRN
Start: 2015-08-01 — End: 2015-08-05
  Administered 2015-08-01 – 2015-08-02 (×2): 10 mg via ORAL
  Filled 2015-08-01 (×2): qty 1

## 2015-08-01 MED ORDER — OLANZAPINE 10 MG IM SOLR
10.0000 mg | Freq: Three times a day (TID) | INTRAMUSCULAR | Status: DC | PRN
Start: 2015-08-01 — End: 2015-08-05

## 2015-08-01 MED ORDER — ACETAMINOPHEN 325 MG PO TABS
650.0000 mg | ORAL_TABLET | Freq: Four times a day (QID) | ORAL | Status: DC | PRN
Start: 2015-08-01 — End: 2015-08-05

## 2015-08-01 MED ORDER — MORPHINE SULFATE 10 MG/ML IJ/IV SOLN (WRAP)
4.0000 mg | Freq: Once | Status: DC
Start: 2015-08-01 — End: 2015-08-01

## 2015-08-01 NOTE — ED Notes (Signed)
Pt reports hearing voices, anxiety, depression, sleeplessness. Pt reports +suicidal ideations, pt has plan (by hanging).

## 2015-08-01 NOTE — ED Notes (Signed)
Pt speaking with telepsych with mother at bedside also.

## 2015-08-01 NOTE — Progress Notes (Signed)
New admit to unit. Pt settling in; socialized briefly with peers. Declined HS meds. Denied SI/HI.Will continue to monitor.

## 2015-08-01 NOTE — Progress Notes (Signed)
Psychiatric Evaluation Part I    Aaron Hood is a 19 y.o. male  who was seen via Telepsych at [facility] on 08/01/2015 by Evangeline Gula.    Call Details  Patient Location: [facility]  Patient Room Number: 11  Time contacted by ED Physician: 1245  Time consult began: 1300  Time (in minutes) from Call to Consult: 15  Time consult concluded: 1320  Referring ED Department  Emergency Department: [facility]        Discharge Planning  Living Arrangements: Family members  Support Systems: Family members  Type of Residence: Private residence  Patient expects to be discharged to:: Accepted into [facility] by Dr. Cyndie Chime    Presenting Mental Status  Orientation Level: Oriented X4, Oriented to person, Oriented to situation, Oriented to time, Oriented to place  Memory: No Impairment  Thought Content: delusions  Thought Process: disorganized  Behavior: normal  Consciousness: Alert  Impulse Control: impaired  Eye Contact: normal  Attitude: cooperative  Mood: depressed, unstable  Hopelessness Affects Goals: Yes  Hopelessness About Future: Yes  Affect: flat  Speech: soft, slow  Concentration: impaired  Insight: fair  Judgment: fair  Appearance: normal  Appetite: normal  Weight change?: normal  Energy: normal  Sleep: normal  Reliability of Reporter/Patient: fair    Tool for Assessment of Suicide Risk  Individual Risk Profile: age 91-30, male  Symptom Risk Profile: positive psychotic symptoms, depressive symptoms  Interview Risk Profile: suicidal ideation, suicidal intent, suicidal plan  Protective Factors: employed  Level of Suicide Risk: High  Monitoring/Suicide Alert Level: Level 3 Suicide Alert    Within the Last 6 Months:: history of violence toward self  Greater than 6 Months Ago:: no history of violence toward self  Violence Toward Self: plan to commit violence towards self              Self-Help Group Involvement  Current Involvement: None  Past Involvement: None  Substance  Recovery Support  Have you been prescribed medications to support recovery?: None  Past Withdrawal Symptoms  Past Withdrawal Symptoms: None  History of Blackouts?: No  History of Withdrawal Seizures?: No    Preliminary Diagnosis #1: paranoid schizophrenia  Preliminary Diagnosis #2: 30        Violence Toward Others  Within the Last 6 Months:: no history of violence toward others  Greater than 6 Months Ago:: no history of violence toward others     Preliminary Diagnosis (DSM IV)  Axis I: paranoid schizophrenia  Axis IV: Primary support group, Occupational  Axis V on Admission: 30  Axis V - Highest in Past Year: 30    Summary:   Patient is a 19 year old male who came to the ED with his mother after his ongoing issues with suicidal ideations and hearing voices to harm himself. Patient reports that he's been hearing these voices off/on for 4-6 weeks. The patient reports his symptoms of self-harm are caused by the voices saying, " you should just kill yourself and do it now." Patient reports having a plan to hang himself. Patient experienced visual disturbances as well with feeling family & friends are physically too close to him which is causing him to feel paranoid.Patient believes during this encounter that his family/friends are trying to kill him. Patient reports his hallucinations causing issues with him at work in particular in the afternoon as he experiences anxiety through rapid heartbeat and the inability to concentrate. Patient was hospitalized four weeks ago for similar issues  and was discharged after a three day inpatient stay. Patient has used marijuana in the past and experimented with Adderall two months ago otherwise no substance use. Patient is currently living with his sister along with his niece and nephew.      Disposition:   Patient accepted into [facility] by Dr. Petra Kuba Pre-authorization information:  N/A    Evangeline Gula    [facility]  [address]  [place], [place] [address]  [phone]

## 2015-08-01 NOTE — Progress Notes (Addendum)
Patient arrived at the unit at 1600 with his mother. Came via medical transport from [facility], wearing paper gown. Contraband removed. Returned patient's shoes after he removed shoelaces. $95 sent to security. Shirt, sweat pants, sweater, and shoelaces in locked closet. Patient came on voluntary status. Reported hearing voices for about a month. Suicidal ideation started 2-3 weeks ago, "I started thinking about death a lot, I was afraid I would die." Today "I feel it in my stomach and my heart, go get a knife and kill myself." Stated SI was mostly due to uncontrolled anxiety. "The anxiety is killing me, making me depressed." Also reported paranoid thoughts, "I don't trust nobody anymore." This also started about a month ago. No medical issues reported. Admits to taking 1 15mg  tab of Adderall a month and a half ago. Smoked marijuana on and off, last time was a month and a half ago. Drinks socially. Patient oriented to the unit, informed of cameras in the common areas. Treatment plan initiated. Patient was admitted to a psychiatric unit in [place] for the same symptoms a month ago. Per patient he was not started on medications, and no follow-up appointment was made because he was moving to [place].

## 2015-08-01 NOTE — ED Provider Notes (Signed)
EMERGENCY DEPARTMENT HISTORY AND PHYSICAL EXAM     Physician/Midlevel provider first contact with patient: 08/01/15 1153         Date: 08/01/2015  Patient Name: Aaron Hood    History of Presenting Illness     Chief Complaint   Patient presents with   . Suicidal       History Provided By: Patient and Patient's Mother    Chief Complaint: SI  Onset: This morning  Timing: Persistent  Location: Psychiatric  Quality: "I want to kill myself"  Severity: Moderate  Exacerbating factors: None reported  Alleviating factors: None reported  Associated Symptoms: Anxiety, depression, auditory hallucinations ("hearing voices"), visual hallucinations ("seeing shadows"), decreased sleep, pounding palpitations, and decreased appetite.  Pertinent Negatives: Denies vomiting, CP, cough, congestion, or rhinorrhea.    Additional History: Aaron Hood is a 19 y.o. male presenting to the ED with persistent "I want to kill myself" SI starting this morning with associated anxiety, depression, auditory hallucinations ("hearing voices"), visual hallucinations ("seeing shadows"), decreased sleep, pounding palpitations, and decreased appetite. The pt's mother states the pt was admitted to a hospital in [facility] 1 month ago for 3 days for SI and saw a psychiatrist there who said he was fine to go home. She says that was the first time he had SI and he is having another episode today. The pt states he has not slept well for 2 days and "sees shadows" when he does not sleep well. He says the voices he hears are more like chatter and are not telling him to do anything specific. He states he has a plan to kill himself by hanging himself. He says he has not tried to kill himself in the past and did not try to hurt himself earlier today. The pt's mother states the pt lives here permanently with his sister and she is visiting. Denies vomiting, CP, cough, congestion, or rhinorrhea.    PCP: Pcp, Noneorunknown, MD  SPECIALISTS:    No current  facility-administered medications for this encounter.     Current Outpatient Prescriptions   Medication Sig Dispense Refill   . risperiDONE (RISPERDAL) 2 MG tablet Take 1 tablet (2 mg total) by mouth nightly. 14 tablet 0       Past History     Past Medical History:  History reviewed. No pertinent past medical history.    Past Surgical History:  History reviewed. No pertinent past surgical history.    Family History:  History reviewed. No pertinent family history.    Social History:  Social History   Substance Use Topics   . Smoking status: Never Smoker    . Smokeless tobacco: Never Used   . Alcohol Use: No       Allergies:  No Known Allergies    Review of Systems     Review of Systems   Constitutional: Positive for appetite change (Decreased).   HENT: Negative for congestion and rhinorrhea.    Respiratory: Negative for cough.    Cardiovascular: Positive for palpitations (Pounding). Negative for chest pain.   Gastrointestinal: Negative for vomiting.   Psychiatric/Behavioral: Positive for suicidal ideas, hallucinations (Auditory and visual) and sleep disturbance (Decreased sleep). The patient is nervous/anxious.         (+) Depression   All other systems reviewed and are negative.      Physical Exam   BP 146/85 mmHg  Pulse 98  Temp(Src) 99.3 F (37.4 C) (Oral)  Resp 14  Ht 6' (1.829 m)  [employer]  90.719 kg  BMI 27.12 kg/m2  SpO2 99%    Physical Exam   Constitutional: He is oriented to person, place, and time. He appears well-developed and well-nourished.   HENT:   Head: Normocephalic and atraumatic.   Eyes: EOM are normal. Pupils are equal, round, and reactive to light.   Neck: Normal range of motion. Neck supple.   Cardiovascular: Normal rate, regular rhythm and normal heart sounds.    Pulmonary/Chest: Effort normal and breath sounds normal. He has no wheezes.   Abdominal: Soft. There is no tenderness.   Musculoskeletal: Normal range of motion.   Neurological: He is alert and oriented to person, place, and time.    Skin: Skin is warm and dry.   Psychiatric:   Patient expresses SI and has auditory and visual hallucinations.   Nursing note and vitals reviewed.      Diagnostic Study Results     Labs -     Results     Procedure Component Value Units Date/Time    Comprehensive metabolic panel [[id number]]  (Abnormal) Collected:  08/01/15 1214    Specimen Information:  Blood Updated:  08/01/15 1235     Glucose 101 (H) mg/dL      BUN 56.3 mg/dL      Creatinine 1.2 mg/dL      Sodium 875 mEq/L      Potassium 3.5 mEq/L      Chloride 106 mEq/L      CO2 22 mEq/L      Calcium 9.5 mg/dL      Protein, Total 7.5 g/dL      Albumin 4.5 g/dL      AST (SGOT) 22 U/L      ALT 14 U/L      Alkaline Phosphatase 65 U/L      Bilirubin, Total 0.9 mg/dL      Globulin 3.0 g/dL      Albumin/Globulin Ratio 1.5      Anion Gap 11.0     Acetaminophen level [[id number]]  (Abnormal) Collected:  08/01/15 1214    Specimen Information:  Blood Updated:  08/01/15 1235     Acetaminophen Level <6 (L) ug/mL     Salicylate level [[id number]]  (Abnormal) Collected:  08/01/15 1214    Specimen Information:  Blood Updated:  08/01/15 1235     Salicylate Level <5.0 (L) mg/dL     Ethanol (Alcohol) Level [[id number]] Collected:  08/01/15 1214    Specimen Information:  Blood Updated:  08/01/15 1235     Alcohol None Detected mg/dL     GFR [[id number]] Collected:  08/01/15 1214     EGFR >60.0 Updated:  08/01/15 1235    Rapid drug screen, urine [[id number]] Collected:  08/01/15 1214    Specimen Information:  Urine Updated:  08/01/15 1228     Amphetamine Screen, UR Negative      Barbiturate Screen, UR Negative      Benzodiazepine Screen, UR Negative      Cannabinoid Screen, UR Negative      Cocaine, UR Negative      Opiate Screen, UR Negative      PCP Screen, UR Negative     UA with reflex to micro (all hospital ED's and [facility], pts 3 + yrs) [[id number]]  (Abnormal) Collected:  08/01/15 1214    Specimen Information:  Urine Updated:  08/01/15 1224     Urine Type Clean Catch       Color, UA Amber (A)      Clarity, UA  Clear      Specific Gravity UA 1.020      Urine pH 6.0      Leukocyte Esterase, UA Negative      Nitrite, UA Negative      Protein, UR 30 (A)      Glucose, UA 50 (A)      Ketones UA Negative      Urobilinogen, UA Normal mg/dL      Bilirubin, UA Negative      Blood, UA Negative      RBC, UA 0-5 /hpf      WBC, UA 0-5 /hpf      Squamous Epithelial Cells, Urine 0-5 /hpf      Calcium Oxalate Crystals, UA Moderate (A) /hpf      Urine Mucus Present     CBC with differential [[id number]]  (Abnormal) Collected:  08/01/15 1214    Specimen Information:  Blood from Blood Updated:  08/01/15 1219     WBC 7.23 x10 3/uL      Hgb 16.9 g/dL      Hematocrit 40.9 %      Platelets 193 x10 3/uL      RBC 5.42 x10 6/uL      MCV 85.2 fL      MCH 31.2 pg      MCHC 36.6 (H) g/dL      RDW 12 %      MPV 10.5 fL      Neutrophils 72 %      Lymphocytes Automated 14 %      Monocytes 13 %      Eosinophils Automated 0 %      Basophils Automated 1 %      Immature Granulocyte Unmeasured %      Neutrophils Absolute 5.20 x10 3/uL      Abs Lymph Automated 1.05 x10 3/uL      Abs Mono Automated 0.91 x10 3/uL      Abs Eos Automated 0.03 x10 3/uL      Absolute Baso Automated 0.04 x10 3/uL      Absolute Immature Granulocyte Unmeasured x10 3/uL           Radiologic Studies -   Radiology Results (24 Hour)     ** No results found for the last 24 hours. **      .    Medical Decision Making   I am the first provider for this patient.    I reviewed the vital signs, available nursing notes, past medical history, past surgical history, family history and social history.    Vital Signs-Reviewed the patient's vital signs.     No data found.      Pulse Oximetry Analysis - Normal 99% on RA    EKG:  Interpreted by the EP.   Time Interpreted: 12:25   Rate: 83 bpm   Rhythm: Normal Sinus Rhythm    Interpretation: No acute ischemia.    Comparison: No prior study is available for comparison.    Old Medical Records: Nursing notes.     ED  Course:   12:38 PM - Discussed pt case with psych liaison who will talk to the pt.    1:42 PM - Discussed pt case with psych liaison who says that they have an accepting physician (Dr. Cyndie Chime) for admission for the pt at [facility] and to have the pt sign a voluntary admission consent form.    1:44 PM - Updated pt and mother on discussion with psych liaison and plan  to admit to [facility]. They are in agreement with plan.    Provider Notes:   19 y.o. M with SI and AH/VH. Second episode--first was in [facility], where pt was hospitalized for three days. No attempt today, but pt does have plan. Medically clear for psych care.     Dr. Ander Slade is the primary emergency physician of record.        Diagnosis     Clinical Impression:   1. Suicidal ideation    2. Auditory hallucination        Treatment Plan:   ED Disposition     Admit to [facility] Diagnosis: Suicidal ideation  (primary encounter diagnosis)  Auditory hallucination  Admit to [facility]  Accepting Physician: Dr. Cyndie Chime  Bed Type: Psychiatric                      _______________________________      Attestations: This note is prepared by Johnnette Barrios acting as scribe for Catalina Pizza, MD.    Catalina Pizza, MD - The scribe's documentation has been prepared under my direction and personally reviewed by me in its entirety. I confirm that the note above accurately reflects all work, treatment, procedures, and medical decision making performed by me.      _______________________________         Kari Baars, MD  08/09/15 1125

## 2015-08-02 DIAGNOSIS — F29 Unspecified psychosis not due to a substance or known physiological condition: Principal | ICD-10-CM

## 2015-08-02 LAB — ECG 12-LEAD
Atrial Rate: 83 {beats}/min
P Axis: 80 degrees
P-R Interval: 150 ms
Q-T Interval: 332 ms
QRS Duration: 108 ms
QTC Calculation (Bezet): 390 ms
R Axis: 102 degrees
T Axis: 47 degrees
Ventricular Rate: 83 {beats}/min

## 2015-08-02 NOTE — Progress Notes (Signed)
Daily Group Attendance Note:      Community Meeting:Attended No      Group Therapy:Attended No      Creative Therapy:Attended No      Specialty Group:Attended Yes      Recreation Therapy:Attended N/A      Evening Group:Attended Yes      Wrap Group:Attended No

## 2015-08-02 NOTE — Plan of Care (Addendum)
Problem: At Risk for Suicide / Harm to Self AS EVIDENCED BY...  Goal: Identifies stressors and protective factors  Pt will identify 3 protective factors and 3 stressors for SI by day 3 of admission.   Outcome: Not Progressing  SW met with patient to introduce self. Pt is a 19 year-old single A/A male admitted with symptoms of psychoticism including CAH and SI. Pt did not elaborate on circumstances leading up to hospitalization. Pt has had several inpatient psychiatric hospitalizations with similar symptoms. Pt has a longstanding history of schizophrenia. Pt reports that he has had no outpatient psychiatric services. 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 Pt resides with family. Pt reports that he prefers not to take medications during his inpatient stay. If patient discharges over the weekend, patient may contact [facility] @ [phone] for outpatient mental health services. SW will continue to monitor, provide support and be available for discharge planning.

## 2015-08-02 NOTE — H&P (Signed)
PSYCHIATRY ADMISSION NOTE      Patient Name: Aaron Hood     MRN:  [id number]      DOB: 12/04/1995   -   Age: 19 y.o.      Gender: male      Admission Date/Time: 08/01/2015  4:08 PM  Current Date/Time:  08/02/2015 / 2:36 PM  Admitting Physician: Latrelle Dodrill, MD   Attending Physician: Latrelle Dodrill, MD    I. History   Informants:   Patient and chart.    LEGAL STATUS:  Voluntary     CHIEF COMPLAINT or REASON FOR ADMISSION  Auditory hallucinations and SI.         HISTORY OF PRESENT ILLNESS:  (Symptoms and qualifiers:1-3 for brief, at least 4 for extended)    Aaron Hood is a 19 y.o. single, employed, domiciled AAM with no significant past psychiatric history, who was brought to [facility] by his mother for hearing voices to harm himself and SI.    According to chart, when seen by Aaron Hood, the psychiatric liaison, last night, pt reported his symptoms of self-harm are caused by the voices saying "you should just kill yourself and do it now".  Pt reported having a plan to hang himself.  Pt believed that his family or friends were trying to kill him.  Pt reported his hallucinations causing issues with him at work, in particular in the afternoon as he experienced anxiety through rapid heartbeat and the inability to concentrate.     Upon today's evaluation on psychiatric unit, pt reports feeling much better and wants to go home.  Pt reports having been doing well over the past 2-3 weeks.  He denies depressed/elated mood, anxiety, paranoia, A/VHs, SI or HI.  Pt reports that he did not sleep the day before this admission and started to hear "whispering" sound.  Pt denies hearing voices, but hearing the whispering sound made his anxious, panicky and wanting to harm himself.  As he slept well last night in hospital, he now denies all mood/anxiety/psychotic Sxs, SI, HI and wants to go home.  Pt denies current use of alcohol or drugs.    Pt does report that about 4 weeks ago, he was admitted to a  hospital in [place] for a similar situation.  Pt reports he needed to stay awake for work, so he took only 1 tab of Adderall (given to him by his girlfriend) and heard "the whispering sounds" like this time.  But later, in order to explain why he heard the whispering, pt states he did not sleep for about 4-5 days around that time (which does not seem to be consistent with him needing to be awake that he had to take Adderall).  Pt reports he was admitted to the hospital for only 1 day, and discharged without medications or f/u plan since he was about to move to [place].   Per chart, yesterday, it was reported that pt stayed in the hospital for 3 days.  As noted above, pt reports having been doing well in the past 2-3 weeks after the discharge from hospital until yesterday.     This Clinical research associate spoke patient's sister, Aaron Hood [phone]), when she visited patient today on unit.  Sister reports that pt was hearing voices and paranoid when he was admitted to the hospital in [place] a month ago.  Over the past week, sister did notice that pt did not sleep much at night, otherwise she did not notice  or was told by patient about anything else.  However, based on the inconsistency reported by patient, sister had some concerns about pt's safety.  Sister does not believe that pt would be a danger to other.  Pt has just lived with sister in the past 3 weeks.    PAST PSYCHIATRIC HISTORY:     Prior psychiatric history before this episode, prior diagnosis:  As in HPI.  Pt denies h/o mood, anxiety, psychotic Sxs before the hospitalization last month   Prior psychiatric hospitalizations:  1 prior hospitalization to a hospital in [place] in 06/2015 as noted in HPI.   Prior suicide attempts, self-injurious behaviors:  Pt denies.    Prior treatments:   Medications:  None    Psychotherapies:  None.    Current psychiatrist/therapist:  None.     SUBSTANCE USE HISTORY:   Alcohol:  Pt reports drinking socially.  Denies  problems related to drinking.    Illicit drugs:  Pt admits to using MJ when in [place].  Reports he used 1-2 gram/d, 3 days a week.  Reports the MJ helped him to relax but caused paranoia, thus he stopped smoking MJ 1.5 months ago.  Pt denies other illicit drug use.    Prescription drugs: pt denies   Tobacco:  Denies.    Detox history: No   Rehab history: No    MEDICAL HISTORY:  Medical/Surgical history:  No past medical history on file.  No past surgical history on file.    Allergies: No Known Allergies    Home Medications:  Prior to Admission medications    Medication Sig Start Date End Date Taking? Authorizing Provider   Fexofenadine HCl (ALLEGRA PO) Take by mouth.    [provider]       Review of Systems  (Extended 2-9, Complete 10 or more)  A 14 point ROS was done and was negative.    FAMILY HISTORY:   Pt denies family h/o substance abuse or mental illness in the family.  Sister reports that their oldest half-brother has paranoia.    PSYCHOSOCIAL HISTORY:    Born/Raised/Childhood:  Pt reports he was born in [place], [place].  He moved to [place] when he was 19 y/o and grew up basically in [place].  Reports he has 2 sisters and 1 half-brother.   History of child abuse:  Pt reports having good childhood.  Denies h/o emotional, physical or sexual abuse.   Education:  Finish HS.  No college.   Work history:  Currently works at [employer].    Marriage/relationship:  Single, never married, no children.    Living situation: with sister, niece and nephew   Legal problems:  Pt denies.     Contact Information (family, surrogate, DPOA, caretaker, healthcare providers):  Extended Emergency Contact Information  Primary Emergency Contact: Hood,Aaron  Address: [address], [address] [place], [place] [address] [place]  Home Phone: [phone]  Relation: Mother      II. Examination     Physical Examination:  Vitals:   Filed Vitals:    08/01/15 1928 08/02/15 0756   BP:  152/90 134/84   Pulse: 100 72   Temp: 99 F (37.2 C) 98.9 F (37.2 C)   TempSrc: Oral Oral   Resp: 16 18   Height: 1.854 m (6\' 1" )    Weight: 90.719 kg (200 lb)    SpO2: 99%  General Condition:   Well nourished, well developed. No acute distress.   Skin:   Warm and dry. No rash or lesions.  HEENT:   NC/AT.    Neck:   Thyroid gland & Lymph nodes non-palpable.  Chest:    Lungs :  CTAB.  No rales or wheezes.    Heart:   RRR.  S1, S2 audible. No murmurs.    Abdomen:   Non-distended.  Soft and non-tender.  No palpable masses.    Genitals:  Deferred.  Musculoskeletal & Extremities:   No deformities or edema. ROM normal in all extremities.  Muscle tone: normal. Muscle strength 5/5 bilaterally.  Neuro:   A&O x3.  CN II-XII grossly intact. No focal deficits.     Mental Status Exam:  Psychiatric Specialty Examination    1-5 bullets:  problem focused   At least 6 bullets: expanded problem focused    At least 9 bullets:  detailed   All bullets: comprehensive exam   [x]  Vital Signs:      Filed Vitals:    08/02/15 0756   BP: 134/84   Pulse: 72   Temp: 98.9 F (37.2 C)   Resp: 18   SpO2:         General Appearance and Manner:        [x] age appropriate    [] bearded    [] casually dressed      [] deviant     [x] cooperative   [] disheveled    [] older than stated age    [] overweight     [] piercings    [] tattooed    [] thin & gaunt looking    [] well dressed    [] younger than stated age     [x] good eye contact    [] avoidant eye contact    [] hesitant     Musculoskeletal:   [x] normal     [] tremor     [] rigidity     [] flaccid       [] akathesia    [] choreaoathetoid movt      [] tics    Gait:   [x] normal gait     [] gait abnormality_______       Mood:   [x]  fine      [] euthymic     [] anxious      [] sad       [] depressed      [] angry       [] euphoric      [] irritable     Affect:   [x]  full range       [] constricted        [] blunted         [] flat          [] bizarre   Speech:   [x] Normal pitch       [x] normal volume       [] articulation error      [] delayed      [] increased latency of response       [] loud       [] pressured      [] profane       [] soft       [] perseveration   Thought processes:    [x] Normal     [x]  goal directed     [] logical     []  illogical     [] flight of ideas      [] disorganized     [] concrete      []  associations intact      []  abstract reasoning intact  Description of associations:   []   loose     [] circumstantial      [] concrete      [] tangential      [x]  intact   Thought content:  [x] NO delusions, pt denies      [] delusions     [] persecutory       [] referential       [] religious       [] grandiose      [] bizarre        Safety:  [x]  absent of suicidal or homicidal ideation       [] suicidal ideation      [] suicidal plan      [] suicidal intent      [] passive suicidal ideation      [] homicidal ideation       [] homicidal plan      [] homicidal intent   []  actively trying to hurt self       []  agitation       []  preoccupation with violence   Perception:   [x] NO hallucination(s)      [] hallucination(s)    [] auditory      [] visual     [] tactile      [] olfactory      [] gustatory      Pt denies current A/VHs   Orientation:     [x] fully oriented               [] disoriented to:  [] time   []  Person   [] Place    [] Situation   Memory :  [x]  grossly intact     []  immediate memory deficit      []  recent memory deficit      [] remote memory deficit    [] MMSE_________    [] MOCA__________     Attention/Concentration:   [x] normal       []  distractible        [] inattentive   Language:   [x] age appropriate        []  naming okay       []  repetition    Fund of knowledge:   [x] age appropriate       [x]  adequate       [] in adequate       []  above average   Judgment & Insight:  []  intact      []  limited      [x]  fair      [] significantly lacking     []  description_________   Other findings:          Psychiatric / Cognitive Instruments:   None    Imaging / EKG / Labs:   Labs in the last 24 hours  Results     ** No results found for the last  24 hours. **        EKG Results  Cardiology Results     None        Brain ScanNo results found for any visits on 08/01/15.      III. Assessment and Plan (Medical Decision Making)     1. I certify that this patient requires inpatient hospitalization due to acute risk to self    2. Psychiatric Diagnoses:   Axis I:      Unspecified psychotic disorder   R/o cannabis and stimulant abuse.   R/o substance induced psychotic disorder   R/o schizophreniform/schizophrenia.   Axis II:   Deferred.    Axis III:     No past medical history on file.  No past surgical history on file.   Axis IV:  None signficant.    Axis V:    GAF on admission:  20-25   Current GAF:  25-30    3.  Labs reviewed and compared to prior labs in the system. Past medical records reviewed. Coordination of care was discussed with inpatient team and as available with the outpatient team.    4. Assessment / Impression  Aaron Hood is a 19 y.o. SAAM with h/o psychosis (paranoia, AH) for about 1 month, and questionable abuse of cannabis and stimulant drug (Adderall).     Pt denies all current psychiatric Sxs, but due to his inconsistent reports, pt's safety needs further assessment.     5. Suicide Risk Assessment  Suicide Thoughts / Behaviors: Yes  Current Plan: None  Access to firearm: No  Past suicide attempts: None  Diagnosis of MDD, BPAD, Schizophrenia, Cluster B PD, Anorexia, Substance Use: Yes, Psychosis, substance abuse (?).  History of CNS disease, Cancer, HIV, Chronic Pain, ESRD, COPD, SLE: No  Psychosocial Stressors: No  Physical Trauma: No  Emotional Trauma: No  Sexual Trauma: No  Family history of suicide: No  Psychological Features: None  Currently Intoxicated: No  Demographic Risk Factors: No  Protective Factors: Positive social supports    6. Treatment Plans / Recommendations:   Safety:  I certify that inpatient treatment is needed for safety.    Biological plan:   Medications:   Continue Risperdal 2 mg po qhs.  Encourage  compliance.   Work-up:  None at this time.    Consult:  None at this time.    Psychological plan:   Individual therapy:  Psycho-education, insight-oriented, supportive therapy will be given during daily visits.   Group and milieu therapies: daily per unit's schedule.   Social services & discharge plan:   D/C to home when safe and stable.   Refer to OP psychiatrist.     Total time spent: 70 minutes (floor time) with more than 50 percent of time in direct patient contact, coordinating care and counseling.      Signed by: Latrelle Dodrill, MD  08/02/2015  2:36 PM

## 2015-08-02 NOTE — Discharge Instructions (Addendum)
Post Hospital Continuing Care Plan, including the After Discharge Summary (AVS) and the Psychiatrists Discharge Summary, can be viewed by Dr. Corrin Parker who has access to Epic on 08/05/15.    Recommended that you comply with the following plan:    If mental health needs arises, please contact:    Occupational hygienist [facility])  [address] 4th floor  [place], [place] [address]  T- [phone]  Appointment: 12/30 at 3:00 pm with Dr. Corrin Parker    If you should need a medication refill prior to your appointment with Dr. Corrin Parker, please go to:    [facility]  [address] 4th floor  [place], [place] [address]  T- [phone]  Appointment: walk-ins accepted Monday-Saturday from 10:00-6:00 pm    [facility]  [address]  [place], [place] [address]  T- [phone]  Appointment: 12/6 at 11:40 am with Dr. Thomasene Mohair, MD (for high blood pressure)      For emergency mental health, contact: [facility] at [phone] or [facility] at [phone]; 911 or go to the closest emergency room department.

## 2015-08-02 NOTE — UM Notes (Signed)
Patient Class: Inpatient   Admission Date: 08/01/15  Admission Time per MD order: 1646  Type of Review: Initial       PER EMR  Patient admitted from the ED voluntary. Patient presented to the ED accompanied by his mother for SI and hearing voices to harm himself.  Patient reports SI with plan to hang himself. Patient reports VH with family & friends physically too close to him which is causing him to feel paranoid. Patient believes during this encounter that his family/friends are trying to kill him. Patient reports his hallucinations causing issues with him at work in particular in the afternoon as he experiences anxiety through rapid heartbeat and the inability to concentrate    Axis I: paranoid schizophrenia    V.S: [id number]/90-99%       Precipitant: reports symptoms related to voices     Past psych hx: patient reported admitted four weeks ago     Psychiatrist: unknown at the time of review     Therapist: unknown at the time of review     Home medications: none     Social hx: lives with sister, niece and nephew     Substance abuse hx : marijuana in the past and experimented with Adderall two months ago otherwise no substance use.  UDS- negative  BAL- none detected       Mental Status:  Orientation Level: Oriented X4, Oriented to person, Oriented to situation, Oriented to time,   Thought Content: delusions  Thought Process: disorganized  Consciousness: Alert  Impulse Control: impaired  Attitude: cooperative  Mood: depressed, unstable  Hopelessness   Affect: flat  Concentration: impaired  Insight: fair  Judgment: fair  Appearance: normal  Appetite: normal  Sleep: normal    Medications ordered by MD:  Risperdal 2mg  PO QHS   Zyprexa 10mg  PO QHS PRN   Ambien 10mg  PO QHS PRN       Tx plan  Q 15 min checks   Safety Plan  [facility]  Medication Management and stabilization  Collaboration with outpatient providers and support system      Discharge planning: home with outpatient providers     ELOS: 3-5 days

## 2015-08-02 NOTE — Progress Notes (Signed)
Pt slept throughout the night. Maintained safety.

## 2015-08-02 NOTE — Plan of Care (Signed)
Problem: At Risk for Suicide / Harm to Self AS EVIDENCED BY...  Goal: Attends a minimum number of therapies daily  Pt will attend a minimum of 3 group session daily by day 3 of admission.   Outcome: Not Progressing  Mental Health Therapy Progress Note  Patient has not attended any groups thus far today.   Will continue to encourage group attendance and participation in order to increase positive coping skills and express feelings regarding current situation.

## 2015-08-02 NOTE — Plan of Care (Signed)
Problem: At Risk for Suicide / Harm to Self AS EVIDENCED BY...  Goal: Reduction of suicidal ideation, and no attempts  Pt will report a reduction in SI by 08/04/2015   Outcome: Progressing  At the beginning of the evening shift the patient was asking about going home.  The TDO process was explained to him and patient was told that we would ask the psychiatric liaison who saw him in the emergency room to petition for involuntary hospitalization if he asked to leave against medical advice.  The patient decided to stay because he did not want to be detained.  After his sister went home he was less agitated and requested medication to help him stay calm. He agreed to take PRN zyprexa.  He denies hearing voices at this time, says they are just whispers. He did acknowledge hearing voices telling him to harm himself prior to admission but says that is not happening now.  He denies any thoughts of self harm.  Social with roommate, no behavioral issues.

## 2015-08-02 NOTE — Plan of Care (Signed)
Problem: At Risk for Suicide / Harm to Self AS EVIDENCED BY...  Goal: Reduction of suicidal ideation, and no attempts  Pt will report a reduction in SI by 08/04/2015   Outcome: Progressing  Patient visible in the milieu but had minimal social contact with peers, denies suicidal/homicidal ideations but acknowledged mild symptoms of depression and anxiety. Patient does not have any A.M. Medication at this time , was encouraged to participate in group therapies to develop positive coping skills but he declined . He maintained safety, will continue with health teachings and provide supportive care.

## 2015-08-03 NOTE — Progress Notes (Signed)
Psychiatry Progress Note  (Level 1 = Problem Focused, Level 2 = Expanded Problem Focused, Level 3 = Detailed)    Patient Name: Aaron Hood            Current Date/Time:  11/26/20168:20 PM  MRN:  [id number]                            Attending Physician: Latrelle Dodrill, MD  DOB: 1996-05-03                                Gender: male                              I. History   Informants: chart, patient  A. Chief Complaint or Reason for Admission       Aaron Hood is a 65 y.o. single, employed, domiciled AAM with no significant past psychiatric history, who was brought to [facility] by his mother for hearing voices to harm himself and SI.    B.History of Present Illness: Interval History     (Symptoms and qualifiers:1 for Level 1, 2 for Level 2 and 3+ for Level 3)    Patient is a 19 y.o. single, employed, domiciled AA male brought to the hospital for voices to harm himself and having suicidal thoughts.    The patient is feeling anxious.  He was isolating in his room.  He denies any suicidal thoughts today and denies voices.     C.Medical History  Review of Systems  (Level 1 is none, Level 2 is 1, and Level 3 is 2+)  A complete 14 point ROS was done   Psychiatric: Depression and Anxiety  Constitutional: No complaints    D.Additional Past Psychiatric, Substance Use, Medical, Family and Social History  Psychiatric: No new information available as compared to previous encounter(s)  Substance Use: No new information available as compared to previous encounter(s)  Medical: No new information available as compared to previous encounter(s)  Family: No new information available as compared to previous encounter(s)  Social: No new information available as compared to previous encounter(s)    Medications:   Current Medications     Scheduled     Medication Dose/Rate, Route, Frequency Last Action    risperiDONE (RisperDAL) tablet 2 mg 2 mg, PO, QHS Given: 11/25 2113          PRN     Medication Dose/Rate, Route,  Frequency Last Action    acetaminophen (TYLENOL) tablet 650 mg 650 mg, PO, Q6H PRN Ordered    LORazepam (ATIVAN) tablet 1 mg 1 mg, PO, Q4H PRN Given: 11/26 0959    OLANZapine (ZyPREXA) injection 10 mg No Dose/Rate, IM, TID PRN See Alternative: 11/25 1951    OLANZapine zydis (ZyPREXA ZYDIS) disintegrating tablet 10 mg 10 mg, PO, TID PRN Given: 11/25 1951    zolpidem (AMBIEN) tablet 10 mg 10 mg, PO, QHS PRN Ordered                II. Examination   Vital signs reviewed:   Blood pressure 115/70, pulse 117, temperature 99.4 F (37.4 C), temperature source Oral, resp. rate 20, height 1.854 m (6\' 1" ), weight 90.719 kg (200 lb), SpO2 96 %.     Mental Status Exam  (Level 1 is 1-5, Level 2 is 6-8, Level 3  is 9+)  General appearance: Good grooming  Attitude/Behavior: Calm  Motor: No abnormalities noted  Gait: No obvious abnormalities  Muscle strength and tone: Grossly intact  Speech:   Spontaneous: Yes  Rate and Rhythm: Normal  Volume: Normal  Mood: so-so  Affect:   Range: Constricted  Thought Process:   Coherent: Yes  Associations: Goal-directed  Thought Content:   Delusions:  Not elicited  Depressive Cognitions:  Helpless  Suicidal:  No suicidal thoughts  Homicidal:  No homicidal thoughts  Perceptions:   Hallucinations: No  Insight: Poor  Judgment: Poor  Cognition:   Level of Consciousness: Intact    Psychiatric / Cognitive Instruments: None    Pertinent Physical Exam: not needed today    Imaging / EKG / Labs:   Labs in the last 24 hours  Results     ** No results found for the last 24 hours. **        EKG Results   Cardiology Results     None          III. Assessment and Plan (Medical Decision Making)     1. I certify that this patient continues to require inpatient hospitalization due to acute risk to self    2. Psychiatric Diagnoses   Axis I    Unspecified Schizophrenia Spectrum and Other Psychotic Disorder   Axis II    Deferred   Axis III  No past medical history on file.   Axis IV   Occupational  problems   Economic problems   Axis V   Current GAF: 31-40: impairment in reality testing.    3.All diagnostic procedures completed since admission were reviewed. Past medical records reviewed. Coordination of care was discussed with inpatient team and as available with the outpatient team.    4. On this admission patient educated about and provided input into their treatment plan.  Patient understands potential risks and benefits of proposed treatment plan.     5.  Assessment / Impression  Patient is a 19 y.o. Black male with no psych history except for a few weeks ago admitted for a similar episode, now admitted with voices telling him to kill himself.     Suicide Risk Assessment   Suicide Risk Assessment performed? Yes: Suicide Thoughts / Behaviors: None    Plan / Recommendations:    Biological Plan:  Medications:   Continue current meds  Medical Work-up: None required at this time  Consults: None required at this time    Psychosocial Plan:  Individual therapy: Psycho-education and psychotherapy of the following modalities will continue to be provided during daily visits:Supportive  Group and milieu therapies: daily per unit's schedule.  Continue with Social Work intervention provided for discharge planning including assistance with follow-up psychiatric care.    Plan for Family Involvement: will call his mother  Other Providers Contact Information and Dates Contacted: No Updates    Total Attending time spent 25 minutes (floor time) with more than 50 percent of time in direct patient contact, coordinating care and counseling.    Signed by: Nancy Fetter, MD  08/03/2015  8:20 PM

## 2015-08-03 NOTE — Plan of Care (Signed)
Problem: At Risk for Suicide / Harm to Self AS EVIDENCED BY...  Goal: Attends a minimum number of therapies daily  Pt will attend a minimum of 3 group session daily by day 3 of admission.   Outcome: Not Progressing  Patient has not attended any groups thus far today.   Will continue to encourage group attendance and participation in order to increase positive coping skills and express feelings regarding current situation.

## 2015-08-03 NOTE — Plan of Care (Signed)
Problem: Safety  Goal: Patient will be free from injury during hospitalization  Aaron Hood has been isolative to his room for most of the evening.  He has to be awakened for his bedtime medications.  His affect has been flat and his mood has been pleasant.  He denies SI/HI.

## 2015-08-03 NOTE — Progress Notes (Signed)
Patient slept through the night without incident.  Will continue to monitor safety.

## 2015-08-03 NOTE — Progress Notes (Signed)
Psychiatry Progress Note  (Level 1 = Problem Focused, Level 2 = Expanded Problem Focused, Level 3 = Detailed)    Patient Name: Aaron Hood            Current Date/Time:  11/26/20165:24 PM  MRN:  [id number]                            Attending Physician: Latrelle Dodrill, MD  DOB: [birth year]                                Gender: male                              I. History   Informants: The patient,Treatment team notes,Chart reviews and staff of the unit.  A. Chief Complaint or Reason for Admission       Patient is admitted with suicidal ideations and auditory hallucinations.  B.History of Present Illness: Interval History     (Symptoms and qualifiers:1 for Level 1, 2 for Level 2 and 3+ for Level 3)  Aaron Hood is a 19 y.o. single, employed, domiciled AAM with no significant past psychiatric history, who was brought to [facility] by his mother for hearing voices to harm himself and SI with plan to hang himself.  Pt reported his hallucinations causing issues with him at work, in particular inability to concentrate .  During follow up evaluation today,patient reports feeling better,his mood is good,denies suicidal or homicidal ideation,reports decreased intensity of hearing voices.He states he has attended some groups.He states he is more anxious than depressed.  Per patient he lives with his sister who is very supportive.     C.Medical History  Review of Systems  (Level 1 is none, Level 2 is 1, and Level 3 is 2+)  A complete 14 point ROS was done and was negative except for  Psychiatric: Depression and Anxiety  Constitutional: No complaints  Eyes: No changes  Ears/Nose/Mouth/Throat: No changes  Cardiovascular: No complaints  Respiratory: No complaints  Gastrointestinal: No complaints  Genitourinary: No complaints  Muscular: No complaints  Integumentary: No complaints  Neurological: No complaints  Endocrine: No complaints  Allergy/Immumologic: No complaints  Hematologic/Lymphatic: No  complaints    D.Additional Past Psychiatric, Substance Use, Medical, Family and Social History  Psychiatric: No new information available as compared to previous encounter(s)  Substance Use: No new information available as compared to previous encounter(s)  Medical: No new information available as compared to previous encounter(s)  Family: No new information available as compared to previous encounter(s)  Social: No new information available as compared to previous encounter(s)    Medications:   Current Medications     Scheduled     Medication Dose/Rate, Route, Frequency Last Action    risperiDONE (RisperDAL) tablet 2 mg 2 mg, PO, QHS Given: 11/25 2113          PRN     Medication Dose/Rate, Route, Frequency Last Action    acetaminophen (TYLENOL) tablet 650 mg 650 mg, PO, Q6H PRN Ordered    LORazepam (ATIVAN) tablet 1 mg 1 mg, PO, Q4H PRN Given: 11/26 0959    OLANZapine (ZyPREXA) injection 10 mg No Dose/Rate, IM, TID PRN See Alternative: 11/25 1951    OLANZapine zydis (ZyPREXA ZYDIS) disintegrating tablet 10 mg 10 mg, PO, TID PRN Given: 11/25 1951  zolpidem (AMBIEN) tablet 10 mg 10 mg, PO, QHS PRN Ordered                II. Examination   Vital signs reviewed:   Blood pressure 115/70, pulse 117, temperature 99.4 F (37.4 C), temperature source Oral, resp. rate 20, height 1.854 m (6\' 1" ), weight 90.719 kg (200 lb), SpO2 96 %.     Mental Status Exam  (Level 1 is 1-5, Level 2 is 6-8, Level 3 is 9+)  General appearance: Appears chronological age, Good hygiene and Good grooming  Attitude/Behavior: Calm  Motor: No abnormalities noted  Gait: No obvious abnormalities  Muscle strength and tone: Grossly intact  Speech:   Spontaneous: Yes  Rate and Rhythm: Normal  Volume: Normal  Tone: Normal  Clarity: Yes  Mood: Reported," Anxious "  Affect:   Range: Constricted  Stability: Labile  Appropriateness to thought content: Yes  Intensity: Dull  Thought Process:   Coherent: Yes  Logical:  Yes  Associations: Goal-directed  Thought  Content:   Delusions:  Not elicited  Depressive Cognitions:  None  Suicidal:  No suicidal thoughts  Homicidal:  No homicidal thoughts  Violent Thoughts:  No  Perceptions:   Dissociative Phenomena:  No  Hallucinations: No  Illusions:  No  Insight: Fair  Judgment: Fair  Cognition:   Level of Consciousness: Intact  Orientation: Intact to self, place and time  Recent Memory: Intact  Remote Memory: Intact  Attention and Concentration: Intact  Language: Repetition: Intact  Fund of Knowledge: Good    Psychiatric / Cognitive Instruments: None    Pertinent Physical Exam: Not relevant to current chief complaint/reason for admission    Imaging / EKG / Labs:   Labs in the last 24 hours  Results     ** No results found for the last 24 hours. **        EKG Results   Cardiology Results     None          III. Assessment and Plan (Medical Decision Making)     1. I certify that this patient continues to require inpatient hospitalization due to acute risk to self    2. Psychiatric Diagnoses   Axis I    Unspecified psychotic disorder   R/o cannabis and stimulant abuse.   R/o substance induced psychotic disorder   R/o schizophreniform/schizophrenia   Axis II    Deferred   Axis III  No past medical history on file.   Axis IV   Problems with primary support group   Other psychosocial and environmental problems   Axis V   Current GAF: 21-30: behavior considerably influenced by delusions or hallucinations OR serious impairment in judgment, communication OR inability to function in almost all areas.    3.All diagnostic procedures completed since admission were reviewed. Past medical records reviewed. Coordination of care was discussed with inpatient team and as available with the outpatient team.    4. On this admission patient educated about and provided input into their treatment plan.  Patient understands potential risks and benefits of proposed treatment plan.     5.  Assessment / Impression  Patient is a 19 y.o. Black male  with no past significant psych history admitted with worsening depression and suicidal ideations.Patietnt reported hearing voices saying to kill himself.  Now patient reports feeling better,denies suicidal or homicidal ideations,less anxious,less depressed,significant decrease in auditory hallucinations.    Suicide Risk Assessment   Suicide Risk Assessment performed? Yes: Suicide Thoughts /  Behaviors: None    Plan / Recommendations:    Biological Plan:  Medications: Continue Risperidone 2 mg q hs.  Medical Work-up: None required at this time  Consults: None required at this time    Psychosocial Plan:  Individual therapy: Psycho-education and psychotherapy of the following modalities will continue to be provided during daily visits:Supportive and Insight Oriented Psychotherapy  Group and milieu therapies: daily per unit's schedule.  Continue with Social Work intervention provided for discharge planning including assistance with follow-up psychiatric care.    Plan for Family Involvement: No updates.  Other Providers Contact Information and Dates Contacted: No Updates    Total Attending time spent 35 minutes (floor time) with more than 50 percent of time in direct patient contact, coordinating care and counseling.    Signed by: Fabio Bering, NP  08/03/2015  5:24 PM

## 2015-08-03 NOTE — Plan of Care (Signed)
Problem: At Risk for Suicide / Harm to Self AS EVIDENCED BY...  Goal: Reduction of suicidal ideation, and no attempts  Pt will report a reduction in SI by 08/04/2015   Outcome: Progressing  Patient was mostly isolative to room in the morning, visible briefly in milieu. Cooperative on approach. Denied SI/HI or hallucinations. Endorsed feeling anxious and was given PRN Ativan 1 mg per MAR. Will continue to monitor.

## 2015-08-03 NOTE — Plan of Care (Signed)
Problem: At Risk for Suicide / Harm to Self AS EVIDENCED BY...  Goal: Attends a minimum number of therapies daily  Pt will attend a minimum of 3 group session daily by day 3 of admission.   Outcome: Progressing  Daily Group Attendance Note:      Community Meeting:Attended No      Group Therapy:Attended No      Creative Therapy:Attended No      Specialty Group:Attended Yes      Recreation Therapy:Attended N/A      Evening Group:Attended Yes      Wrap Group:Attended No

## 2015-08-04 NOTE — Plan of Care (Signed)
Problem: At Risk for Suicide / Harm to Self AS EVIDENCED BY...  Goal: Attends a minimum number of therapies daily  Pt will attend a minimum of 3 group session daily by day 3 of admission.   Outcome: Progressing  Daily Group Attendance Note:      Community Meeting:Attended No      Group Therapy:Attended Yes      Creative Therapy:Attended No      Specialty Group:Attended Yes      Recreation Therapy:Attended No      Evening Group:Attended Yes      Wrap Group:Attended Yes

## 2015-08-04 NOTE — Progress Notes (Signed)
Observed sleeping throughout the night without incident. Safety was maintained. Will continue to monitor and support.

## 2015-08-04 NOTE — Plan of Care (Signed)
Problem: At Risk for Suicide / Harm to Self AS EVIDENCED BY...  Goal: Reduction of suicidal ideation, and no attempts  Pt will report a reduction in SI by 08/04/2015   Outcome: Progressing  Patient has been visible on the unit, social with peers, attending some groups. Reported anxiety in the morning and requested medication. PRN Ativan 1 mg given. Denied SI/HI and hallucinations. Will continue to monitor.

## 2015-08-04 NOTE — Progress Notes (Signed)
Psychiatry Progress Note  (Level 1 = Problem Focused, Level 2 = Expanded Problem Focused, Level 3 = Detailed)    Patient Name: Aaron Hood            Current Date/Time:  11/27/201612:40 PM  MRN:  [id number]                            Attending Physician: Latrelle Dodrill, MD  DOB: 1996-06-21                                Gender: male                              I. History   Informants: The patient,Treatment team notes,Chart reviews and staff of the unit.  A. Chief Complaint or Reason for Admission       Patient is admitted with suicidal ideations and auditory hallucinations.  B.History of Present Illness: Interval History     (Symptoms and qualifiers:1 for Level 1, 2 for Level 2 and 3+ for Level 3)  Aaron Hood is a 19 y.o. single, employed, domiciled [place] American male with no significant past psychiatric history, who was brought to [facility] by his mother for hearing voices to harm himself and SI with plan to hang himself.  Pt reported his hallucinations causing issues with him at work, in particular inability to concentrate .  During follow up evaluation today,patient reports feeling better,his mood is good,denies suicidal or homicidal ideation,denies hearing any voices," Before coming to the hospital,I was not sleeping well and was hearing voices but since admission,I am sleeping good,I don't hear any voices.He believes medicine is helping him, states attending most of the groups some groups.He endorsed feeling of anxiety but denies being depressed.  No other complaints reported.    C.Medical History  Review of Systems  (Level 1 is none, Level 2 is 1, and Level 3 is 2+)  A complete 14 point ROS was done and was negative except for  Psychiatric: Depression and Anxiety  Constitutional: No complaints  Eyes: No changes  Ears/Nose/Mouth/Throat: No changes  Cardiovascular: No complaints  Respiratory: No complaints  Gastrointestinal: No complaints  Genitourinary: No complaints  Muscular: No  complaints  Integumentary: No complaints  Neurological: No complaints  Endocrine: No complaints  Allergy/Immumologic: No complaints  Hematologic/Lymphatic: No complaints    D.Additional Past Psychiatric, Substance Use, Medical, Family and Social History  Psychiatric: No new information available as compared to previous encounter(s)  Substance Use: No new information available as compared to previous encounter(s)  Medical: No new information available as compared to previous encounter(s)  Family: No new information available as compared to previous encounter(s)  Social: No new information available as compared to previous encounter(s)    Medications:   Current Medications     Scheduled     Medication Dose/Rate, Route, Frequency Last Action    risperiDONE (RisperDAL) tablet 2 mg 2 mg, PO, QHS Given: 11/26 2139          PRN     Medication Dose/Rate, Route, Frequency Last Action    acetaminophen (TYLENOL) tablet 650 mg 650 mg, PO, Q6H PRN Ordered    LORazepam (ATIVAN) tablet 1 mg 1 mg, PO, Q4H PRN Given: 11/27 0806    OLANZapine (ZyPREXA) injection 10 mg No Dose/Rate, IM, TID PRN  See Alternative: 11/25 1951    OLANZapine zydis (ZyPREXA ZYDIS) disintegrating tablet 10 mg 10 mg, PO, TID PRN Given: 11/25 1951    zolpidem (AMBIEN) tablet 10 mg 10 mg, PO, QHS PRN Ordered                II. Examination   Vital signs reviewed:   Blood pressure 160/96, pulse 109, temperature 96.9 F (36.1 C), temperature source Oral, resp. rate 19, height 1.854 m (6\' 1" ), weight 90.719 kg (200 lb), SpO2 96 %.     Mental Status Exam  (Level 1 is 1-5, Level 2 is 6-8, Level 3 is 9+)  General appearance: Appears chronological age, Good hygiene and Good grooming  Attitude/Behavior: Calm  Motor: No abnormalities noted  Gait: No obvious abnormalities  Muscle strength and tone: Grossly intact  Speech:   Spontaneous: Yes  Rate and Rhythm: Normal  Volume: Normal  Tone: Normal  Clarity: Yes  Mood: Reported," Anxious "  Affect:   Range:  Constricted  Stability: Labile  Appropriateness to thought content: Yes  Intensity: Dull  Thought Process:   Coherent: Yes  Logical:  Yes  Associations: Goal-directed  Thought Content:   Delusions:  Not elicited  Depressive Cognitions:  None  Suicidal:  No suicidal thoughts  Homicidal:  No homicidal thoughts  Violent Thoughts:  No  Perceptions:   Dissociative Phenomena:  No  Hallucinations: No  Illusions:  No  Insight: Fair  Judgment: Fair  Cognition:   Level of Consciousness: Intact  Orientation: Intact to self, place and time  Recent Memory: Intact  Remote Memory: Intact  Attention and Concentration: Intact  Language: Repetition: Intact  Fund of Knowledge: Good    Psychiatric / Cognitive Instruments: None    Pertinent Physical Exam: Not relevant to current chief complaint/reason for admission    Imaging / EKG / Labs:   Labs in the last 24 hours  Results     ** No results found for the last 24 hours. **        EKG Results   Cardiology Results     None          III. Assessment and Plan (Medical Decision Making)     1. I certify that this patient continues to require inpatient hospitalization due to acute risk to self    2. Psychiatric Diagnoses   Axis I    Unspecified psychotic disorder   R/o cannabis and stimulant abuse.   R/o substance induced psychotic disorder   R/o schizophreniform/schizophrenia   Axis II    Deferred   Axis III  No past medical history on file.   Axis IV   Problems with primary support group   Other psychosocial and environmental problems   Axis V   Current GAF: 21-30: behavior considerably influenced by delusions or hallucinations OR serious impairment in judgment, communication OR inability to function in almost all areas.    3.All diagnostic procedures completed since admission were reviewed. Past medical records reviewed. Coordination of care was discussed with inpatient team and as available with the outpatient team.    4. On this admission patient educated about and provided  input into their treatment plan.  Patient understands potential risks and benefits of proposed treatment plan.     5.  Assessment / Impression  Patient is a 19 y.o. Black male with no past significant psych history admitted with worsening depression and suicidal ideations.Patietnt reported hearing voices saying to kill himself.  Now patient reports feeling  better,denies suicidal or homicidal ideations,less anxious,less depressed,significant decrease in auditory hallucinations.    Suicide Risk Assessment   Suicide Risk Assessment performed? Yes: Suicide Thoughts / Behaviors: None    Plan / Recommendations:    Biological Plan:  Medications: Continue Risperidone 2 mg q hs.  Medical Work-up: None required at this time  Consults: None required at this time    Psychosocial Plan:  Individual therapy: Psycho-education and psychotherapy of the following modalities will continue to be provided during daily visits:Supportive and Insight Oriented Psychotherapy  Group and milieu therapies: daily per unit's schedule.  Continue with Social Work intervention provided for discharge planning including assistance with follow-up psychiatric care.    Plan for Family Involvement: No updates.  Other Providers Contact Information and Dates Contacted: No Updates    Total Attending time spent 35 minutes (floor time) with more than 50 percent of time in direct patient contact, coordinating care and counseling.    Signed by: Fabio Bering, NP  08/04/2015  12:40 PM

## 2015-08-04 NOTE — Plan of Care (Signed)
Problem: At Risk for Suicide / Harm to Self AS EVIDENCED BY...  Goal: Attends a minimum number of therapies daily  Pt will attend a minimum of 3 group session daily by day 3 of admission.   Visible on the unit, attending groups and social with select peers. Mildly anxious, denies being depressed, denies SI/HI, hallucinations currently. Cooperative, no behavioral issues. Will continue to monitor.

## 2015-08-04 NOTE — Plan of Care (Signed)
Problem: At Risk for Suicide / Harm to Self AS EVIDENCED BY...  Goal: Attends a minimum number of therapies daily  Pt will attend a minimum of 3 group session daily by day 3 of admission.   Outcome: Progressing  MENTAL HEALTH THERAPY ASSESSMENT/PROGRESS NOTE      AFFECT/MOOD:  Appropriate    THOUGHT PROCESS:  Concrete    THOUGHT CONTENT:  Within Normal Limits and Othersome paranoia    INTERPERSONAL:  Attentive    LEARNING INTERVENTIONS:    IDENTIFICATION OF FEELINGS  /PROBLEM SOLVING SKILLS:   Partially Achieved    COPING STRATEGIES:  Partially Achieved   Patient attended one group out of three thus far today.  Patient engaged in task and drew a picture and then proceeded to tear up the drawing, stating, "I wrote my name on it and didn't want people to know that," which may be indicative of paranoid or suspicious thoughts.  Patient talked about smoking marajuana and some of the negative side effects he has had from it. Patient talked about having had a panic attack, which he stated led to his admission.  Interactive and supportive to peers.  Will continue to encourage group attendance and participation in order to increase positive coping skills and express feelings regarding current situation.

## 2015-08-05 MED ORDER — RISPERIDONE 2 MG PO TABS
2.0000 mg | ORAL_TABLET | Freq: Every evening | ORAL | Status: AC
Start: 2015-08-05 — End: ?

## 2015-08-05 NOTE — Discharge Summary -  Nursing (Signed)
Pt to be discharged to home with family. Able to verbalize follow up plan. Plan for the day is to do yoga.  Discharge papers signed and filed; copy given to pt. Prescription with pt.  All belongings with pt including his money.  Pt cooperative. Feels safe to leave. Denies SI. Has maintained safety and left the unit with staff to escort to his family car.

## 2015-08-05 NOTE — Discharge Summary (Signed)
PSYCHIATRY DISCHARGE SUMMARY  AND POST DISCHARGE CONTINUING CARE PLAN    Date/Time: 08/05/2015 5:09 PM  Patient Name: Aaron Hood, Aaron Hood  MRN#: [id number]  Age: 19 y.o.   Date of Birth: September 14, 1995    Date of Admission:   08/01/2015  Date of Discharge:     08/05/2015  Admitting Physician:    Latrelle Dodrill, MD  Discharge Physician:   Cyndia Skeeters MD    Event leading to hospitalization / Reason for Hospitalization    Chief Complaint or Reason for Admission          Legal Status on admission was voluntary.    Discharge Diagnoses:        Principal Discharge Diagnosis:    Unspecified psychotic disorder        Other  Psychiatric Diagnoses   Axis I: R/o cannabis and stimulant abuse.   R/o substance induced psychotic disorder   R/o schizophreniform/schizophrenia.   Axis II: Deferred.    Axis III:   Past Medical History     No past medical history on file.      Past Surgical History     No past surgical history on file.      Axis IV: None signficant.    Axis V:    GAF on admission: 20-25           GAF 40                Labs/Scans/EKG     No results found for any visits on 08/01/15.  Cardiology Results           Results for ZACKERY, BRINE (MRN [id number]) as of 08/05/2015 17:12   Ref. Range 08/01/2015 12:14 08/02/2015 09:48   Hematocrit Latest Ref Range: 42.0-52.0 % 46.2    Platelet Count Latest Ref Range: 140-400 x10 3/uL 193    RBC Latest Ref Range: 4.70-6.00 x10 6/uL 5.42    MCV Latest Ref Range: 80.0-100.0 fL 85.2    MCH, POC Latest Ref Range: 28.0-32.0 pg 31.2    MCHC Latest Ref Range: 32.0-36.0 g/dL 29.5 (H)    RDW Latest Ref Range: 12-15 % 12    MPV Latest Ref Range: 9.4-12.3 fL 10.5    Neutrophils Latest Ref Range: None % 72    Lymphocytes Automated Latest Ref Range: None % 14    Monocytes Latest Ref Range: None % 13    Eosinophils Automated Latest Ref Range: None % 0    Basophils Automated Latest Ref Range: None % 1    Immature Granulocyte Latest Ref Range: None % Unmeasured    Neutrophils Absolute  Latest Ref Range: 1.80-8.10 x10 3/uL 5.20    Abs Lymph Automated Latest Ref Range: 0.50-4.40 x10 3/uL 1.05    Abs Eos Automated Latest Ref Range: 0.00-0.70 x10 3/uL 0.03    Abs Mono Automated Latest Ref Range: 0.00-1.20 x10 3/uL 0.91    Absolute Baso Automated Latest Ref Range: 0.00-0.20 x10 3/uL 0.04    Absolute Immature Granulocyte Latest Ref Range: 0 x10 3/uL Unmeasured    Glucose Latest Ref Range: 70-100 mg/dL 621 (H)    BUN Latest Ref Range: 9.0-28.0 mg/dL 30.8    Creatinine Latest Ref Range: 0.7-1.3 mg/dL 1.2    Sodium Latest Ref Range: 136-145 mEq/L 139    Potassium Latest Ref Range: 3.5-5.1 mEq/L 3.5    Chloride Latest Ref Range: 100-111 mEq/L 106    CO2 Latest Ref Range: 22-29 mEq/L 22    Calcium Latest Ref Range: 8.5-10.5 mg/dL  9.5    Anion Gap Latest Ref Range: 5.0-15.0  11.0    EGFR Unknown >60.0    AST (SGOT) Latest Ref Range: 5-34 U/L 22    ALT Latest Ref Range: 0-55 U/L 14    Alkaline Phosphatase Latest Ref Range: 38-106 U/L 65    Albumin Latest Ref Range: 3.5-5.0 g/dL 4.5    Protein, Total Latest Ref Range: 6.0-8.3 g/dL 7.5    Globulin Latest Ref Range: 2.0-3.6 g/dL 3.0    Albumin/Globulin Ratio Latest Ref Range: 0.9-2.2  1.5    Bilirubin, Total Latest Ref Range: 0.2-1.2 mg/dL 0.9    Acetaminophen, MCH Latest Ref Range: 10-30 ug/mL <6 (L)    Alcohol Latest Ref Range: None Detected mg/dL None Detected    Salicylate Level Latest Ref Range: 15.0-30.0 mg/dL <1.6 (L)    Urine Type Unknown Clean Catch    Color, UA Latest Ref Range: Clear - Yellow  Amber (A)    Clarity, UA Latest Ref Range: Clear - Hazy  Clear    Specific Gravity, UA Latest Ref Range: 1.001-1.035  1.020    Urine pH Latest Ref Range: 5.0-8.0  6.0    Leukocyte Esterase, UA Latest Ref Range: Negative  Negative    Nitrite, UA Latest Ref Range: Negative  Negative    Protein, UR Latest Ref Range: Negative  30 (A)    Glucose, UA Latest Ref Range: Negative  50 (A)    Ketones UA Latest Ref Range: Negative  Negative    Urobilinogen, UA Latest Ref  Range: 0.2  -  2.0 mg/dL Normal    Bilirubin, UA Latest Ref Range: Negative  Negative    Blood, UA Latest Ref Range: Negative  Negative    RBC UA Latest Ref Range: 0-5 /hpf 0-5    WBC, UA Latest Ref Range: 0-5 /hpf 0-5    Squamous Epithelial Cells, Urine Latest Ref Range: 0-25 /hpf 0-5    Calcium Oxalate Crystals, UA Latest Ref Range: Occasional /hpf Moderate (A)    Urine Mucus Latest Ref Range: None  Present    Amphetamines, UR Latest Ref Range: Negative  Negative    Barbituates, UR Latest Ref Range: Negative  Negative    Benzodiazepine Screen, UR Latest Ref Range: Negative  Negative    Cannabinoid Screen, UR Latest Ref Range: Negative  Negative    Cocaine, UR Latest Ref Range: Negative  Negative    Opiate Screen, UR Latest Ref Range: Negative  Negative    PCP Screen, UR Latest Ref Range: Negative  Negative    EKG SCAN Unknown  Attch   NSR    Consults:     Consult Orders     None            Discharge Day Evaluation     Blood pressure 140/89, pulse 84, temperature 98.1 F (36.7 C), temperature source Oral, resp. rate 17, height 1.854 m (6\' 1" ), weight 90.719 kg (200 lb), SpO2 95 %.    Mental Status Exam    General appearance: Appears chronological age, Good hygiene and Good grooming  Attitude/Behavior: Calm  Motor: No abnormalities noted  Gait: No obvious abnormalities  Muscle strength and tone: Grossly intact  Speech:   Spontaneous: Yes  Rate and Rhythm: Normal  Volume: Normal  Tone: Normal  Clarity: Yes  Mood: good  Affect:   Range: Full  Stability: Stable  Appropriateness to thought content: Yes  Intensity: Normal  Thought Process:   Coherent: Yes  Logical:  Yes  Associations:  Goal-directed  Thought Content:   Delusions:  Not elicited  Depressive Cognitions:  None  Suicidal:  No suicidal thoughts  Homicidal:  No homicidal thoughts  Perceptions:   Dissociative Phenomena:  No  Hallucinations: No  Insight: Good  Judgment: Fair  Cognition:       Level of Consciousness: Intact    Review of Systems  A complete 14  point ROS was done and was negative except for  Psychiatric: Anxiety  Psychiatric: Anxietyrelated to [place] [facility] Course:   Pt was admitted on 11/24 for the second time in 1 mos with hearing a voice. Most recently it told him to kill himself adn he had a plan to hang himself. He had not been sleeping and reported use of stimulants and MJ. After admission adn sleep he reported feeling bette, His PE was wnl . He had no Si hi and a cessation of voices after starting risperdal 2 mg nightly. On 11.26 he cont to improve and was out and visible in the millieu. He was without si hi. On 11/27 hew as tolerating the ,eds well and attending groups. Eating adn sleeping well and with no si hi or voices. On 11/27 he remained improved . He requested [place] on 11/28 and conversation with sister confirmed he was at baseline. He agreed to FU as out pt including his BP as it was borderline high probably related to anxiety to admission. He was improved and without si or psychosis and agreed to cont risperdal and was dcd in stable condition.    Suicidal and Homicidal Status on Discharge:   Patient denies suicidal ideation, intent and plan.    Discharge Instructions:   Discharge Disposition: Home - Self Care    Discharge Instructions given to: Patient and Family    Questions that may arise between hospital discharge and your first follow-up appointment should be directed to [facility] Emergency Services: [phone]; 911 or the closest emergency department    Case discussion was held between inpatient treatment team and outpatient provider(s).  Discharge Plan:         Patient Instructions       [facility] Continuing Care Plan, including the After Discharge Summary (AVS) and the Psychiatrists Discharge Summary, can be viewed by Dr. Corrin Parker who has access to Epic on 08/05/15.    Recommended that you comply with the following plan:    If mental health needs arises, please contact:    [facility])  [address] 4th floor  [place], [place] [address]  T- [phone]  Appointment: 12/30 at 3:00 pm with Dr. Corrin Parker    If you should need a medication refill prior to your appointment with Dr. Corrin Parker, please go to:    [facility]  [address] 4th floor  [place], [place] [address]  T- [phone]  Appointment: walk-ins accepted Monday-Saturday from 10:00-6:00 pm    [facility]  [address]  [place], [place] [address]  T- [phone]  Appointment: 12/6 at 11:40 am with Dr. Thomasene Mohair, MD (for high blood pressure)      For emergency mental health, contact: [facility] Emergency [facility] at [phone] or [facility] at [phone]; 911 or go to the closest emergency room department.      Attestation:   The patient has been seen and evaluated by me,  Rayvon Char, NP and Dr Laqueta Linden    Patient discharged  on more than 1 antipsychotic medication? No  On discharge, was the patient on any psychotropic medication off label? No  I spent at least 35 minutes coordinating the discharge and reviewing the discharge plan.    Discharge Medications:     Tobacco Cessation Discharge Prescription for Cessation Medication  Patient was assessed on admission and found not to be a tobacco user       Discharge Medication List      Taking          risperiDONE 2 MG tablet   Dose:  2 mg   Commonly known as:  RisperDAL   Take 1 tablet (2 mg total) by mouth nightly.   Notes to Patient:  psychosis         STOP taking these medications          ALLEGRA PO   Notes to Patient:  allergies               Signed by: Rayvon Char, NP   08/05/2015  5:09 PM          ATTENDING'S ATTESTATION:  The patient  was seen and evaluated by myself with Ms. Sherrie Mustache, NP.  I performed the critical portion of the service (history, mental status exam and medical decision-making).     Pt reported doing fine. Denied depressed/elated mood, SI, HI, A/VHs.  No  delusions noted.  Pt wanted to go home.  Treatment team called and spoke with pt's sister (with whom pt is living).  Sister believed pt is safe for discharge at this point.     The above note of the NP has been reviewed in its entirety, and any additions or edits have been made directly into the note.   I agreed with the findings and plan of care as documented in the NP's note.   The assessment and plan were discussed with the patient and NP.   Latrelle Dodrill, MD  08/05/2015 / 8:09 PM

## 2015-08-05 NOTE — Plan of Care (Signed)
Problem: At Risk for Suicide / Harm to Self AS EVIDENCED BY...  Goal: Reduction of suicidal ideation, and no attempts  Pt will report a reduction in SI by 08/04/2015   Outcome: Progressing  Pt denies SI, HI, and AVH. Visible on the unit. Cooperative. Encouraged to attend groups. Said he does not feel anxious this morning. Has maintained safety and will continue to monitor.

## 2015-08-05 NOTE — Plan of Care (Signed)
Problem: At Risk for Suicide / Harm to Self AS EVIDENCED BY...  Goal: Attends a minimum number of therapies daily  Pt will attend a minimum of 3 group session daily by day 3 of admission.   Outcome: Progressing  Mental Health Therapy Progress Note    Patient has only attended community meeting thus far today.  Pt. Presents with appropriate affect and even mood.  During community meeting, pt. Established a goal for the day: "Try not to be so a anxious."  Will continue to encourage group attendance and participation in order to increase positive coping skills and express feelings regarding current situation.

## 2015-08-05 NOTE — Plan of Care (Signed)
Problem: At Risk for Suicide / Harm to Self AS EVIDENCED BY...  Goal: Identifies stressors and protective factors  Pt will identify 3 protective factors and 3 stressors for SI by day 3 of admission.   Outcome: Progressing  SW met with patient and patient's mood is improving. Pt denies SI. Pt did not attend morning group therapies. Pt is compliant with psychiatric drug treatment. Pt inquired about discharging to home on this date. In the meantime, pt is scheduled @ [facility] with Dr. Corrin Parker on 12/30 @ 3:00 pm. SW will continue to monitor, provide support and be available for discharge planning.

## 2015-08-05 NOTE — Plan of Care (Addendum)
Problem: At Risk for Suicide / Harm to Self AS EVIDENCED BY...  Goal: Identifies stressors and protective factors  Pt will identify 3 protective factors and 3 stressors for SI by day 3 of admission.   Outcome: Adequate for Discharge  Pt will be discharged to home on this date. Pt is scheduled @ [facility] with Dr. Corrin Parker on 12/30 at 3:00 pm. SW provided patient with contact information to [facility] for medication evaluation/refill prior to appointment with Dr. Corrin Parker. Pt is also scheduled @ [facility]) with Dr. Thomasene Mohair on 12/6 @ 11:40 am. SW will be available if additional needs arises.

## 2015-08-13 ENCOUNTER — Ambulatory Visit (INDEPENDENT_AMBULATORY_CARE_PROVIDER_SITE_OTHER)
Payer: No Typology Code available for payment source | Admitting: Student in an Organized Health Care Education/Training Program

## 2015-09-06 ENCOUNTER — Inpatient Hospital Stay (HOSPITAL_BASED_OUTPATIENT_CLINIC_OR_DEPARTMENT_OTHER): Payer: No Typology Code available for payment source | Admitting: Psychiatry
